# Patient Record
Sex: Male | Born: 1985
Health system: Southern US, Community
[De-identification: ages and names within clinical notes are randomized; demographics above are authoritative.]

## PROBLEM LIST (undated history)

## (undated) DIAGNOSIS — N289 Disorder of kidney and ureter, unspecified: Secondary | ICD-10-CM

## (undated) DIAGNOSIS — I10 Essential (primary) hypertension: Secondary | ICD-10-CM

## (undated) HISTORY — DX: Essential (primary) hypertension: I10

---

## 2015-04-05 DIAGNOSIS — I10 Essential (primary) hypertension: Secondary | ICD-10-CM | POA: Insufficient documentation

## 2015-04-05 DIAGNOSIS — R0683 Snoring: Secondary | ICD-10-CM | POA: Insufficient documentation

## 2017-05-21 DIAGNOSIS — R030 Elevated blood-pressure reading, without diagnosis of hypertension: Secondary | ICD-10-CM | POA: Diagnosis not present

## 2017-05-21 DIAGNOSIS — S339XXA Sprain of unspecified parts of lumbar spine and pelvis, initial encounter: Secondary | ICD-10-CM | POA: Diagnosis not present

## 2017-05-24 ENCOUNTER — Other Ambulatory Visit: Payer: Self-pay

## 2017-05-24 ENCOUNTER — Ambulatory Visit (INDEPENDENT_AMBULATORY_CARE_PROVIDER_SITE_OTHER): Payer: 59 | Admitting: Physician Assistant

## 2017-05-24 ENCOUNTER — Encounter: Payer: Self-pay | Admitting: Physician Assistant

## 2017-05-24 VITALS — BP 150/107 | HR 109 | Temp 98.9°F | Resp 18 | Ht 69.69 in | Wt 311.4 lb

## 2017-05-24 DIAGNOSIS — I10 Essential (primary) hypertension: Secondary | ICD-10-CM

## 2017-05-24 DIAGNOSIS — M545 Low back pain, unspecified: Secondary | ICD-10-CM

## 2017-05-24 DIAGNOSIS — M62838 Other muscle spasm: Secondary | ICD-10-CM

## 2017-05-24 LAB — POCT URINALYSIS DIP (MANUAL ENTRY)
Bilirubin, UA: NEGATIVE
Blood, UA: NEGATIVE
Glucose, UA: NEGATIVE mg/dL
Ketones, POC UA: NEGATIVE mg/dL
LEUKOCYTES UA: NEGATIVE
Nitrite, UA: NEGATIVE
Spec Grav, UA: 1.02 (ref 1.010–1.025)
Urobilinogen, UA: 0.2 E.U./dL
pH, UA: 5.5 (ref 5.0–8.0)

## 2017-05-24 MED ORDER — NAPROXEN 500 MG PO TABS: 500.0000 mg | ORAL_TABLET | Freq: Two times a day (BID) | ORAL | 0 refills | Status: DC

## 2017-05-24 MED ORDER — BLOOD PRESSURE MONITOR DELUXE KIT: 1.0000 [IU] | PACK | Freq: Every day | 0 refills | Status: DC

## 2017-05-24 MED ORDER — LISINOPRIL 10 MG PO TABS: 10.0000 mg | ORAL_TABLET | Freq: Every day | ORAL | 0 refills | Status: DC

## 2017-05-24 NOTE — Patient Instructions (Addendum)
I recommend resting today. However, tomorrow I would begin walking and moving around as much as tolerated. Begin stretching in a couple of days. The worse thing you can do for low back pain is lie in bed all day or sit down all day. Use medications as needed.   Just to know, flexeril can cause side effects that may impair your thinking or reactions. Be careful if you drive or do anything that requires you to be awake and alert. void drinking alcohol, which can increase some of the side effects of Flexeril.  NSAIDs like naproxen have common side effects of heartburn, stomach pain, indigestion, and headache. Could lead to renal insufficiency, stroke, or GI bleed if taken excess amounts outside of what is recommended on label long term.    You should avoid heavy lifting or strenuous repetitive activity to prevent recurrence of event  Use heat pad, do not apply directly to skin, use barrier such as towel over the skin. Leave on for 15-20 minutes, 3-4 times a day.  Please perform exercises below. Stretches are to be performed for 2 sets, holding 10-15 seconds each. Recommended to perform this rehab twice daily within pain tolerance for 2 weeks.  In terms of elevated blood pressure, I would like you to start taking lisinopril daily. Check your blood pressure at least a couple times over the next week outside of the office and document these values. It is best if you check the blood pressure at different times in the day. Your goal is <140/90. Please return in 1-2 weeks for reevaluation.  If you start to have chest pain, blurred vision, shortness of breath, severe headache, lower leg swelling, or nausea/vomiting please seek care immediately here or at the ED.    FLEXION RANGE OF MOTION AND STRETCHING EXERCISES: STRETCH - Flexion, Single Knee to Chest   Lie on a firm bed or floor with both legs extended in front of you.  Keeping one leg in contact with the floor, bring your opposite knee to your chest.  Hold your leg in place by either grabbing behind your thigh or at your knee.  Pull until you feel a gentle stretch in your lower back.   Slowly release your grasp and repeat the exercise with the opposite side.  STRETCH - Flexion, Double Knee to Chest   Lie on a firm bed or floor with both legs extended in front of you.  Keeping one leg in contact with the floor, bring your opposite knee to your chest.  Tense your stomach muscles to support your back and then lift your other knee to your chest. Hold your legs in place by either grabbing behind your thighs or at your knees.  Pull both knees toward your chest until you feel a gentle stretch in your lower back.   Tense your stomach muscles and slowly return one leg at a time to the floor.  STRETCH - Low Trunk Rotation  Lie on a firm bed or floor. Keeping your legs in front of you, bend your knees so they are both pointed toward the ceiling and your feet are flat on the floor.  Extend your arms out to the side. This will stabilize your upper body by keeping your shoulders in contact with the floor.  Gently and slowly drop both knees together to one side until you feel a gentle stretch in your lower back.   Tense your stomach muscles to support your lower back as you bring your knees back to  the starting position. Repeat the exercise to the other side.   EXTENSION RANGE OF MOTION AND FLEXIBILITY EXERCISES: STRETCH - Extension, Prone on Elbows   Lie on your stomach on the floor, a bed will be too soft. Place your palms about shoulder width apart and at the height of your head.  Place your elbows under your shoulders. If this is too painful, stack pillows under your chest.  Allow your body to relax so that your hips drop lower and make contact more completely with the floor.  Slowly return to lying flat on the floor.  RANGE OF MOTION - Extension, Prone Press Ups  Lie on your stomach on the floor, a bed will be too soft. Place your  palms about shoulder width apart and at the height of your head.  Keeping your back as relaxed as possible, slowly straighten your elbows while keeping your hips on the floor. You may adjust the placement of your hands to maximize your comfort. As you gain motion, your hands will come more underneath your shoulders.  Slowly return to lying flat on the floor.  RANGE OF MOTION- Quadruped, Neutral Spine   Assume a hands and knees position on a firm surface. Keep your hands under your shoulders and your knees under your hips. You may place padding under your knees for comfort.  Drop your head and point your tail bone toward the ground below you. This will round out your lower back like an angry cat.    Slowly lift your head and release your tail bone so that your back sags into a large arch, like an old horse.  Repeat this until you feel limber in your lower back.  Now, find your "sweet spot." This will be the most comfortable position somewhere between the two previous positions. This is your neutral spine. Once you have found this position, tense your stomach muscles to support your lower back.  STRENGTHENING EXERCISES - Low Back Strain These exercises may help you when beginning to rehabilitate your injury. These exercises should be done near your "sweet spot." This is the neutral, low-back arch, somewhere between fully rounded and fully arched, that is your least painful position. When performed in this safe range of motion, these exercises can be used for people who have either a flexion or extension based injury. These exercises may resolve your symptoms with or without further involvement from your physician, physical therapist or athletic trainer. While completing these exercises, remember:   Muscles can gain both the endurance and the strength needed for everyday activities through controlled exercises.  Complete these exercises as instructed by your physician, physical therapist or  athletic trainer. Increase the resistance and repetitions only as guided.  You may experience muscle soreness or fatigue, but the pain or discomfort you are trying to eliminate should never worsen during these exercises. If this pain does worsen, stop and make certain you are following the directions exactly. If the pain is still present after adjustments, discontinue the exercise until you can discuss the trouble with your caregiver.  STRENGTHENING - Deep Abdominals, Pelvic Tilt  Lie on a firm bed or floor. Keeping your legs in front of you, bend your knees so they are both pointed toward the ceiling and your feet are flat on the floor.  Tense your lower abdominal muscles to press your lower back into the floor. This motion will rotate your pelvis so that your tail bone is scooping upwards rather than pointing at your feet or  into the floor.  STRENGTHENING - Abdominals, Crunches   Lie on a firm bed or floor. Keeping your legs in front of you, bend your knees so they are both pointed toward the ceiling and your feet are flat on the floor. Cross your arms over your chest.  Slightly tip your chin down without bending your neck.  Tense your abdominals and slowly lift your trunk high enough to just clear your shoulder blades. Lifting higher can put excessive stress on the lower back and does not further strengthen your abdominal muscles.  Control your return to the starting position.  STRENGTHENING - Quadruped, Opposite UE/LE Lift   Assume a hands and knees position on a firm surface. Keep your hands under your shoulders and your knees under your hips. You may place padding under your knees for comfort.  Find your neutral spine and gently tense your abdominal muscles so that you can maintain this position. Your shoulders and hips should form a rectangle that is parallel with the floor and is not twisted.  Keeping your trunk steady, lift your right hand no higher than your shoulder and then your  left leg no higher than your hip. Make sure you are not holding your breath.   Continuing to keep your abdominal muscles tense and your back steady, slowly return to your starting position. Repeat with the opposite arm and leg.  STRENGTHENING - Lower Abdominals, Double Knee Lift  Lie on a firm bed or floor. Keeping your legs in front of you, bend your knees so they are both pointed toward the ceiling and your feet are flat on the floor.  Tense your abdominal muscles to brace your lower back and slowly lift both of your knees until they come over your hips. Be certain not to hold your breath.  POSTURE AND BODY MECHANICS CONSIDERATIONS - Low Back Strain Keeping correct posture when sitting, standing or completing your activities will reduce the stress put on different body tissues, allowing injured tissues a chance to heal and limiting painful experiences. The following are general guidelines for improved posture. Your physician or physical therapist will provide you with any instructions specific to your needs. While reading these guidelines, remember:  The exercises prescribed by your provider will help you have the flexibility and strength to maintain correct postures.  The correct posture provides the best environment for your joints to work. All of your joints have less wear and tear when properly supported by a spine with good posture. This means you will experience a healthier, less painful body.  Correct posture must be practiced with all of your activities, especially prolonged sitting and standing. Correct posture is as important when doing repetitive low-stress activities (typing) as it is when doing a single heavy-load activity (lifting). RESTING POSITIONS Consider which positions are most painful for you when choosing a resting position. If you have pain with flexion-based activities (sitting, bending, stooping, squatting), choose a position that allows you to rest in a less flexed  posture. You would want to avoid curling into a fetal position on your side. If your pain worsens with extension-based activities (prolonged standing, working overhead), avoid resting in an extended position such as sleeping on your stomach. Most people will find more comfort when they rest with their spine in a more neutral position, neither too rounded nor too arched. Lying on a non-sagging bed on your side with a pillow between your knees, or on your back with a pillow under your knees will often provide  some relief. Keep in mind, being in any one position for a prolonged period of time, no matter how correct your posture, can still lead to stiffness. PROPER SITTING POSTURE In order to minimize stress and discomfort on your spine, you must sit with correct posture. Sitting with good posture should be effortless for a healthy body. Returning to good posture is a gradual process. Many people can work toward this most comfortably by using various supports until they have the flexibility and strength to maintain this posture on their own. When sitting with proper posture, your ears will fall over your shoulders and your shoulders will fall over your hips. You should use the back of the chair to support your upper back. Your lower back will be in a neutral position, just slightly arched. You may place a small pillow or folded towel at the base of your lower back for support.  When working at a desk, create an environment that supports good, upright posture. Without extra support, muscles tire, which leads to excessive strain on joints and other tissues. Keep these recommendations in mind: CHAIR:  A chair should be able to slide under your desk when your back makes contact with the back of the chair. This allows you to work closely.  The chair's height should allow your eyes to be level with the upper part of your monitor and your hands to be slightly lower than your elbows. BODY POSITION  Your feet should  make contact with the floor. If this is not possible, use a foot rest.  Keep your ears over your shoulders. This will reduce stress on your neck and lower back. INCORRECT SITTING POSTURES  If you are feeling tired and unable to assume a healthy sitting posture, do not slouch or slump. This puts excessive strain on your back tissues, causing more damage and pain. Healthier options include:  Using more support, like a lumbar pillow.  Switching tasks to something that requires you to be upright or walking.  Talking a brief walk.  Lying down to rest in a neutral-spine position. PROLONGED STANDING WHILE SLIGHTLY LEANING FORWARD  When completing a task that requires you to lean forward while standing in one place for a long time, place either foot up on a stationary 2-4 inch high object to help maintain the best posture. When both feet are on the ground, the lower back tends to lose its slight inward curve. If this curve flattens (or becomes too large), then the back and your other joints will experience too much stress, tire more quickly, and can cause pain. CORRECT STANDING POSTURES Proper standing posture should be assumed with all daily activities, even if they only take a few moments, like when brushing your teeth. As in sitting, your ears should fall over your shoulders and your shoulders should fall over your hips. You should keep a slight tension in your abdominal muscles to brace your spine. Your tailbone should point down to the ground, not behind your body, resulting in an over-extended swayback posture.  INCORRECT STANDING POSTURES  Common incorrect standing postures include a forward head, locked knees and/or an excessive swayback. WALKING Walk with an upright posture. Your ears, shoulders and hips should all line-up. PROLONGED ACTIVITY IN A FLEXED POSITION When completing a task that requires you to bend forward at your waist or lean over a low surface, try to find a way to stabilize 3  out of 4 of your limbs. You can place a hand or elbow on  your thigh or rest a knee on the surface you are reaching across. This will provide you more stability so that your muscles do not fatigue as quickly. By keeping your knees relaxed, or slightly bent, you will also reduce stress across your lower back. CORRECT LIFTING TECHNIQUES DO :   Assume a wide stance. This will provide you more stability and the opportunity to get as close as possible to the object which you are lifting.  Tense your abdominals to brace your spine. Bend at the knees and hips. Keeping your back locked in a neutral-spine position, lift using your leg muscles. Lift with your legs, keeping your back straight.  Test the weight of unknown objects before attempting to lift them.  Try to keep your elbows locked down at your sides in order get the best strength from your shoulders when carrying an object.  Always ask for help when lifting heavy or awkward objects. INCORRECT LIFTING TECHNIQUES DO NOT:   Lock your knees when lifting, even if it is a small object.  Bend and twist. Pivot at your feet or move your feet when needing to change directions.  Assume that you can safely pick up even a paper clip without proper posture.     How to Take Your Blood Pressure You can take your blood pressure at home with a machine. You may need to check your blood pressure at home:  To check if you have high blood pressure (hypertension).  To check your blood pressure over time.  To make sure your blood pressure medicine is working.  Supplies needed: You will need a blood pressure machine, or monitor. You can buy one at a drugstore or online. When choosing one:  Choose one with an arm cuff.  Choose one that wraps around your upper arm. Only one finger should fit between your arm and the cuff.  Do not choose one that measures your blood pressure from your wrist or finger.  Your doctor can suggest a monitor. How to  prepare Avoid these things for 30 minutes before checking your blood pressure:  Drinking caffeine.  Drinking alcohol.  Eating.  Smoking.  Exercising.  Five minutes before checking your blood pressure:  Pee.  Sit in a dining chair. Avoid sitting in a soft couch or armchair.  Be quiet. Do not talk.  How to take your blood pressure Follow the instructions that came with your machine. If you have a digital blood pressure monitor, these may be the instructions: 1. Sit up straight. 2. Place your feet on the floor. Do not cross your ankles or legs. 3. Rest your left arm at the level of your heart. You may rest it on a table, desk, or chair. 4. Pull up your shirt sleeve. 5. Wrap the blood pressure cuff around the upper part of your left arm. The cuff should be 1 inch (2.5 cm) above your elbow. It is best to wrap the cuff around bare skin. 6. Fit the cuff snugly around your arm. You should be able to place only one finger between the cuff and your arm. 7. Put the cord inside the groove of your elbow. 8. Press the power button. 9. Sit quietly while the cuff fills with air and loses air. 10. Write down the numbers on the screen. 11. Wait 2-3 minutes and then repeat steps 1-10.  What do the numbers mean? Two numbers make up your blood pressure. The first number is called systolic pressure. The second is called diastolic  pressure. An example of a blood pressure reading is "120 over 80" (or 120/80). If you are an adult and do not have a medical condition, use this guide to find out if your blood pressure is normal: Normal  First number: below 120.  Second number: below 80. Elevated  First number: 120-129.  Second number: below 80. Hypertension stage 1  First number: 130-139.  Second number: 80-89. Hypertension stage 2  First number: 140 or above.  Second number: 90 or above. Your blood pressure is above normal even if only the top or bottom number is above normal. Follow  these instructions at home:  Check your blood pressure as often as your doctor tells you to.  Take your monitor to your next doctor's appointment. Your doctor will: ? Make sure you are using it correctly. ? Make sure it is working right.  Make sure you understand what your blood pressure numbers should be.  Tell your doctor if your medicines are causing side effects. Contact a doctor if:  Your blood pressure keeps being high. Get help right away if:  Your first blood pressure number is higher than 180.  Your second blood pressure number is higher than 120. This information is not intended to replace advice given to you by your health care provider. Make sure you discuss any questions you have with your health care provider. Document Released: 02/13/2008 Document Revised: 01/29/2016 Document Reviewed: 08/09/2015 Elsevier Interactive Patient Education  2018 [employer].   Hypertension Hypertension is another name for high blood pressure. High blood pressure forces your heart to work harder to pump blood. This can cause problems over time. There are two numbers in a blood pressure reading. There is a top number (systolic) over a bottom number (diastolic). It is best to have a blood pressure below 120/80. Healthy choices can help lower your blood pressure. You may need medicine to help lower your blood pressure if:  Your blood pressure cannot be lowered with healthy choices.  Your blood pressure is higher than 130/80.  Follow these instructions at home: Eating and drinking  If directed, follow the DASH eating plan. This diet includes: ? Filling half of your plate at each meal with fruits and vegetables. ? Filling one quarter of your plate at each meal with whole grains. Whole grains include whole wheat pasta, brown rice, and whole grain bread. ? Eating or drinking low-fat dairy products, such as skim milk or low-fat yogurt. ? Filling one quarter of your plate at each meal with  low-fat (lean) proteins. Low-fat proteins include fish, skinless chicken, eggs, beans, and tofu. ? Avoiding fatty meat, cured and processed meat, or chicken with skin. ? Avoiding premade or processed food.  Eat less than 1,500 mg of salt (sodium) a day.  Limit alcohol use to no more than 1 drink a day for nonpregnant women and 2 drinks a day for men. One drink equals 12 oz of beer, 5 oz of wine, or 1 oz of hard liquor. Lifestyle  Work with your doctor to stay at a healthy weight or to lose weight. Ask your doctor what the best weight is for you.  Get at least 30 minutes of exercise that causes your heart to beat faster (aerobic exercise) most days of the week. This may include walking, swimming, or biking.  Get at least 30 minutes of exercise that strengthens your muscles (resistance exercise) at least 3 days a week. This may include lifting weights or pilates.  Do not use  any products that contain nicotine or tobacco. This includes cigarettes and e-cigarettes. If you need help quitting, ask your doctor.  Check your blood pressure at home as told by your doctor.  Keep all follow-up visits as told by your doctor. This is important. Medicines  Take over-the-counter and prescription medicines only as told by your doctor. Follow directions carefully.  Do not skip doses of blood pressure medicine. The medicine does not work as well if you skip doses. Skipping doses also puts you at risk for problems.  Ask your doctor about side effects or reactions to medicines that you should watch for. Contact a doctor if:  You think you are having a reaction to the medicine you are taking.  You have headaches that keep coming back (recurring).  You feel dizzy.  You have swelling in your ankles.  You have trouble with your vision. Get help right away if:  You get a very bad headache.  You start to feel confused.  You feel weak or numb.  You feel faint.  You get very bad pain in  your: ? Chest. ? Belly (abdomen).  You throw up (vomit) more than once.  You have trouble breathing. Summary  Hypertension is another name for high blood pressure.  Making healthy choices can help lower blood pressure. If your blood pressure cannot be controlled with healthy choices, you may need to take medicine. This information is not intended to replace advice given to you by your health care provider. Make sure you discuss any questions you have with your health care provider. Document Released: 08/19/2007 Document Revised: 01/29/2016 Document Reviewed: 01/29/2016 Elsevier Interactive Patient Education  2018 [employer].   IF you received an x-ray today, you will receive an invoice from [facility]. Please contact [facility] at [phone] with questions or concerns regarding your invoice.   IF you received labwork today, you will receive an invoice from [facility]. Please contact [facility] at [phone] with questions or concerns regarding your invoice.   Our billing staff will not be able to assist you with questions regarding bills from these companies.  You will be contacted with the lab results as soon as they are available. The fastest way to get your results is to activate your My Chart account. Instructions are located on the last page of this paperwork. If you have not heard from [place] regarding the results in 2 weeks, please contact this office.

## 2017-05-24 NOTE — Progress Notes (Addendum)
Anthony Edwards  MRN: [id number] DOB: 1985/10/12  Subjective:   Anthony Edwards is a 32 y.o. male who presents for evaluation of low back pain. The patient has had no prior back problems. Symptoms have been present for 3 days and are gradually improving.  Onset was related to / precipitated by no known injury. 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  Notes he has been lifting more heavy objects at work recently and also moving things out of his home. The pain is located in the right lumbar area and does not radiate. The pain is described as sharp and dull and occurs all day and is worse with movement. Symptoms are exacerbated by standing, twisting, bending, and standing. Symptoms are improved by heat, NSAIDs and rest.  He denies weakness in the right leg, weakness in the left leg, tingling in the right leg, tingling in the left leg, burning pain in the right leg, burning pain in the left leg, urinary hesitancy, urinary incontinence, urinary retention, bowel incontinence, constipation, impotence and groin/perineal numbness associated with the back pain. The patient has no "red flag" history indicative of complicated back pain. Went to urgent care 3 days ago and was dx with muscle spasm. Given Rx for flexeril. Has also used heating pad. Was feeling better yesterday. Did not take muscle relaxant yesterday and then woke up this morning with more pain.   In terms of HTN, had dx a couple of years ago. Was on chlorthalidone 55m daily. Took it for 30 days but then stopped when he ran out and never followed up. Checked it once in the past year and it was 1149systolically. Had lost 100lbs but gained 50 lbs back. He is not currently exercising outside of work, which is strenuous. Diet consists of various things, eats mostly at home. Does not add salt to meals. Drinks mostly water and sweet tea. Denies smoking. Occasional alcohol use. Denies chest pain,  heart papliations, dizziness, SOB, diaphoresis, chronic headache, visual disturbance, hematuria, and lower leg swelling.  His old PCP also referred him for sleep study due to daytime sleepiness and snoring but pt did not go. Notes this is not an issue at this time and declines referral.   Review of Systems  Constitutional: Negative for chills, fatigue and fever.  Gastrointestinal: Negative for abdominal pain, nausea and vomiting.  Genitourinary: Negative for flank pain, hematuria and testicular pain.  Neurological: Negative for speech difficulty.    There are no active problems to display for this patient.   Current Outpatient Medications on File Prior to Visit  Medication Sig Dispense Refill  . chlorthalidone (HYGROTON) 25 MG tablet Take 25 mg by mouth.    . cyclobenzaprine (FLEXERIL) 10 MG tablet TK 1 T PO BID PRM  0   No current facility-administered medications on file prior to visit.     No Known Allergies    Social History   Socioeconomic History  . Marital status: Single    Spouse name: Not on file  . Number of children: 0  . Years of education: Not on file  . Highest education level: Not on file  Social Needs  . Financial resource strain: Not on file  . Food insecurity - worry: Not on file  . Food insecurity - inability: Not on file  . Transportation needs - medical: Not on file  . Transportation needs - non-medical: Not on file  Occupational History  .  Not on file  Tobacco Use  . Smoking status: Never Smoker  . Smokeless tobacco: Never Used  Substance and Sexual Activity  . Alcohol use: Yes    Alcohol/week: 1.8 oz    Types: 3 Cans of beer per week  . Drug use: No  . Sexual activity: Yes  Other Topics Concern  . Not on file  Social History Narrative  . Not on file    Objective:  BP (!) 150/107   Pulse (!) 109   Temp 98.9 F (37.2 C) (Oral)   Resp 18   Ht 5' 9.69" (1.77 m)   Wt (!) 311 lb 6.4 oz (141.3 kg)   SpO2 98%   BMI 45.09 kg/m   Physical  Exam  Constitutional: He is oriented to person, place, and time.  Well-developed, well-nourished male. Appears uncomfortable sitting on exam chair.   HENT:  Head: Normocephalic and atraumatic.  Eyes: Conjunctivae are normal.  Neck: Normal range of motion.  Cardiovascular: Normal rate, regular rhythm, normal heart sounds and intact distal pulses.  Pulmonary/Chest: Effort normal and breath sounds normal. He has no wheezes. He has no rhonchi. He has no rales.  Abdominal: There is no CVA tenderness.  Musculoskeletal:       Cervical back: Normal.       Thoracic back: Normal.       Lumbar back: He exhibits decreased range of motion (ROM not accessed as patient does not want to aggravate spasm), tenderness (exquisite tenderness with palpation of right sided musculature, spasm palpated, tendneress in musculature when right leg is raised) and spasm. He exhibits no bony tenderness and no swelling.       Right lower leg: He exhibits no swelling.       Left lower leg: He exhibits no swelling.  Neurological: He is alert and oriented to person, place, and time. He has a normal Straight Leg Raise Test. Gait normal.  Reflex Scores:      Patellar reflexes are 2+ on the right side and 2+ on the left side.      Achilles reflexes are 2+ on the right side and 2+ on the left side. Strength is 5/5 of bilateral lower extremities.  Sensation of bilateral lower extremities intact.   Skin: Skin is warm and dry.  Psychiatric: Affect normal.  Vitals reviewed.    BP Readings from Last 3 Encounters:  05/24/17 (!) 150/107   Results for orders placed or performed in visit on 05/24/17 (from the past 24 hour(s))  POCT urinalysis dipstick     Status: Abnormal   Collection Time: 05/24/17 12:49 PM  Result Value Ref Range   Color, UA yellow yellow   Clarity, UA clear clear   Glucose, UA negative negative mg/dL   Bilirubin, UA negative negative   Ketones, POC UA negative negative mg/dL   Spec Grav, UA 1.020 1.010 -  1.025   Blood, UA negative negative   pH, UA 5.5 5.0 - 8.0   Protein Ur, POC =30 (A) negative mg/dL   Urobilinogen, UA 0.2 0.2 or 1.0 E.U./dL   Nitrite, UA Negative Negative   Leukocytes, UA Negative Negative     Assessment and Plan :  1. Acute right-sided low back pain without sciatica 2. Muscle spasm History and physical exam findings consistent with muscle spasm.  No red flags noted in history or physical exam. No midline tenderness.  Neuro exam intact.  Recommend heating pad, stretching, anti-inflammatories, and muscle relaxant as needed.  Given Neurosurgeon for  stretches.  Given strict return precautions. - naproxen (NAPROSYN) 500 MG tablet; Take 1 tablet (500 mg total) by mouth 2 (two) times daily with a meal.  Dispense: 30 tablet; Refill: 0  3. Essential hypertension Uncontrolled in office.  Likely partially due to pt being in pain but with his dx of HTN recommend restarting blood pressure medication today.  He is otherwise asymptomatic.  Labs pending.  Instructed to check bp outside of office over the next couple of weeks.  Educated on healthy lifestyle modifications.  Patient is motivated to eat healthier and lose weight.  Return in 1-2 weeks for reevaluation.  Given strict ED precautions.  - CBC with Differential/Platelet - CMP14+EGFR - TSH - Lipid panel - POCT urinalysis dipstick - lisinopril (PRINIVIL,ZESTRIL) 10 MG tablet; Take 1 tablet (10 mg total) by mouth daily.  Dispense: 30 tablet; Refill: 0 - Blood Pressure Monitoring (BLOOD PRESSURE MONITOR DELUXE) KIT; 1 Units by Does not apply route daily.  Dispense: 1 kit; Refill: 0  Tenna Delaine PA-C  [facility] 05/24/2017 10:34 PM

## 2017-05-25 LAB — CBC WITH DIFFERENTIAL/PLATELET
BASOS ABS: 0 10*3/uL (ref 0.0–0.2)
Basos: 0 %
EOS (ABSOLUTE): 0.2 10*3/uL (ref 0.0–0.4)
Eos: 2 %
Hematocrit: 47.6 % (ref 37.5–51.0)
Hemoglobin: 16 g/dL (ref 13.0–17.7)
Immature Grans (Abs): 0.1 10*3/uL (ref 0.0–0.1)
Immature Granulocytes: 1 %
LYMPHS ABS: 4.8 10*3/uL — AB (ref 0.7–3.1)
Lymphs: 40 %
MCH: 29.6 pg (ref 26.6–33.0)
MCHC: 33.6 g/dL (ref 31.5–35.7)
MCV: 88 fL (ref 79–97)
MONOS ABS: 0.7 10*3/uL (ref 0.1–0.9)
Monocytes: 6 %
NEUTROS ABS: 6.3 10*3/uL (ref 1.4–7.0)
Neutrophils: 51 %
PLATELETS: 331 10*3/uL (ref 150–379)
RBC: 5.4 x10E6/uL (ref 4.14–5.80)
RDW: 14.4 % (ref 12.3–15.4)
WBC: 12.1 10*3/uL — ABNORMAL HIGH (ref 3.4–10.8)

## 2017-05-25 LAB — CMP14+EGFR
ALBUMIN: 4.6 g/dL (ref 3.5–5.5)
ALK PHOS: 79 IU/L (ref 39–117)
ALT: 21 IU/L (ref 0–44)
AST: 15 IU/L (ref 0–40)
Albumin/Globulin Ratio: 1.6 (ref 1.2–2.2)
BUN/Creatinine Ratio: 15 (ref 9–20)
BUN: 16 mg/dL (ref 6–20)
Bilirubin Total: 0.3 mg/dL (ref 0.0–1.2)
CHLORIDE: 103 mmol/L (ref 96–106)
CO2: 22 mmol/L (ref 20–29)
Calcium: 10.1 mg/dL (ref 8.7–10.2)
Creatinine, Ser: 1.08 mg/dL (ref 0.76–1.27)
GFR calc Af Amer: 104 mL/min/{1.73_m2} (ref >59)
GFR calc non Af Amer: 90 mL/min/{1.73_m2} (ref >59)
Globulin, Total: 2.8 g/dL (ref 1.5–4.5)
Glucose: 92 mg/dL (ref 65–99)
Potassium: 4.5 mmol/L (ref 3.5–5.2)
Sodium: 145 mmol/L — ABNORMAL HIGH (ref 134–144)
Total Protein: 7.4 g/dL (ref 6.0–8.5)

## 2017-05-25 LAB — LIPID PANEL
Chol/HDL Ratio: 3.3 ratio (ref 0.0–5.0)
Cholesterol, Total: 143 mg/dL (ref 100–199)
HDL: 44 mg/dL (ref >39)
LDL Calculated: 76 mg/dL (ref 0–99)
Triglycerides: 113 mg/dL (ref 0–149)
VLDL Cholesterol Cal: 23 mg/dL (ref 5–40)

## 2017-05-25 LAB — TSH: TSH: 2.3 u[IU]/mL (ref 0.450–4.500)

## 2017-05-27 ENCOUNTER — Encounter: Payer: Self-pay | Admitting: Physician Assistant

## 2017-05-28 ENCOUNTER — Encounter: Payer: Self-pay | Admitting: Physician Assistant

## 2017-05-28 ENCOUNTER — Ambulatory Visit (INDEPENDENT_AMBULATORY_CARE_PROVIDER_SITE_OTHER): Payer: 59 | Admitting: Physician Assistant

## 2017-05-28 ENCOUNTER — Telehealth: Payer: Self-pay | Admitting: Physician Assistant

## 2017-05-28 ENCOUNTER — Other Ambulatory Visit: Payer: Self-pay

## 2017-05-28 VITALS — BP 138/82 | HR 113 | Temp 98.9°F | Ht 70.08 in | Wt 313.0 lb

## 2017-05-28 DIAGNOSIS — M545 Low back pain, unspecified: Secondary | ICD-10-CM

## 2017-05-28 DIAGNOSIS — R319 Hematuria, unspecified: Secondary | ICD-10-CM | POA: Diagnosis not present

## 2017-05-28 DIAGNOSIS — I10 Essential (primary) hypertension: Secondary | ICD-10-CM | POA: Diagnosis not present

## 2017-05-28 DIAGNOSIS — M62838 Other muscle spasm: Secondary | ICD-10-CM

## 2017-05-28 LAB — POC MICROSCOPIC URINALYSIS (UMFC): Mucus: ABSENT

## 2017-05-28 LAB — POCT URINALYSIS DIP (MANUAL ENTRY)
BILIRUBIN UA: NEGATIVE
GLUCOSE UA: NEGATIVE mg/dL
Ketones, POC UA: NEGATIVE mg/dL
Nitrite, UA: NEGATIVE
PH UA: 6 (ref 5.0–8.0)
Protein Ur, POC: 30 mg/dL — AB
SPEC GRAV UA: 1.02 (ref 1.010–1.025)
Urobilinogen, UA: 0.2 E.U./dL

## 2017-05-28 LAB — CREATININE, SERUM
CREATININE: 0.92 mg/dL (ref 0.76–1.27)
GFR calc non Af Amer: 109 mL/min/{1.73_m2} (ref >59)
GFR, EST AFRICAN AMERICAN: 126 mL/min/{1.73_m2} (ref >59)

## 2017-05-28 NOTE — Telephone Encounter (Signed)
[facility]  [facility] can't [facility] for Monday without it being STAT. Can we get the order updated to say this and I will call them back to schedule? Thanks!

## 2017-05-28 NOTE — Patient Instructions (Addendum)
You should hear from [facility] for [facility] renal study over the weekend or on Monday for the scan. If you develop any new pain over the weekend please contact our office or seek care at an urgent care. Thank you for letting me participate in your health and well being.    IF you received an x-ray today, you will receive an invoice from [facility]. Please contact [facility] at [phone] with questions or concerns regarding your invoice.   IF you received labwork today, you will receive an invoice from [facility]. Please contact [facility] at [phone] with questions or concerns regarding your invoice.   Our billing staff will not be able to assist you with questions regarding bills from these companies.  You will be contacted with the lab results as soon as they are available. The fastest way to get your results is to activate your My Chart account. Instructions are located on the last page of this paperwork. If you have not heard from us regarding the results in 2 weeks, please contact this office.

## 2017-05-28 NOTE — Telephone Encounter (Signed)
Updated. Thanks

## 2017-05-28 NOTE — Progress Notes (Addendum)
NIKODEM LEADBETTER  MRN: [id number] DOB: 1985/03/21  Subjective:  Anthony Edwards is a 32 y.o. male seen in office today for a chief complaint of follow up on back pain. Pt initially seen on 05/24/17 for low back pain. It started after lots of heavy lifting last week. PE findings consistent with muscle spasm. Also found to have uncontrolled untreated HTN at that visits. Labs obtained. UA +protein. Tx for muscle spasms with naproxen and continued flexeril, stretching, and heat. Started lisinopril for HTN. Please see that OV note for additional details. Today, he reports that his back pain is much better. Resting it is about a 3/10. However, when he tries to bend (or lift heavy objects), it exacerbates his pain to 8/10. He has taken medication as prescribed. He is applying heat, some stretching. He is requesting a few more days off as he has to continuously lift objects weighing >50lbs while at work. He denies weakness in the right leg, weakness in the left leg, tingling in the right leg, tingling in the left leg, burning pain in the right leg, burning pain in the left leg, hematuria, urinary hesitancy, urinary incontinence, urinary retention, bowel incontinence, constipation, impotence and groin/perineal numbness associated with the back pain. Has hx of kidneys stones, has had 2 non obstructed ones in the past. Denies smoking. Denies recent travel. No PH of bladder cancer.   HTN: Has been taking bp medication as prescribed. Has not check bp at home.   Review of Systems  Constitutional: Negative for chills, diaphoresis and fever.  HENT: Negative for congestion.   Respiratory: Negative for cough.   Cardiovascular: Negative for chest pain.  Genitourinary: Negative for difficulty urinating and frequency.    There are no active problems to display for this patient.   Current Outpatient Medications on File Prior to Visit  Medication Sig Dispense Refill  . Blood Pressure Monitoring (BLOOD PRESSURE MONITOR  DELUXE) KIT 1 Units by Does not apply route daily. 1 kit 0  . cyclobenzaprine (FLEXERIL) 10 MG tablet TK 1 T PO BID PRM  0  . lisinopril (PRINIVIL,ZESTRIL) 10 MG tablet Take 1 tablet (10 mg total) by mouth daily. 30 tablet 0  . naproxen (NAPROSYN) 500 MG tablet Take 1 tablet (500 mg total) by mouth 2 (two) times daily with a meal. 30 tablet 0   No current facility-administered medications on file prior to visit.     No Known Allergies    Social History   Socioeconomic History  . Marital status: Significant Other    Spouse name: Not on file  . Number of children: 0  . Years of education: Not on file  . Highest education level: Not on file  Social Needs  . Financial resource strain: Not on file  . Food insecurity - worry: Not on file  . Food insecurity - inability: Not on file  . Transportation needs - medical: Not on file  . Transportation needs - non-medical: Not on file  Occupational History  . Not on file  Tobacco Use  . Smoking status: Never Smoker  . Smokeless tobacco: Never Used  Substance and Sexual Activity  . Alcohol use: Yes    Alcohol/week: 1.8 oz    Types: 3 Cans of beer per week  . Drug use: No  . Sexual activity: Yes  Other Topics Concern  . Not on file  Social History Narrative  . Not on file    Objective:  BP 138/82 (BP Location: Left Arm, Patient  Position: Sitting, Cuff Size: Large)   Pulse (!) 113   Temp 98.9 F (37.2 C) (Oral)   Ht 5' 10.08" (1.78 m)   Wt (!) 313 lb (142 kg)   SpO2 97%   BMI 44.81 kg/m   Physical Exam  Constitutional: He is oriented to person, place, and time and well-developed, well-nourished, and in no distress.  HENT:  Head: Normocephalic and atraumatic.  Eyes: Conjunctivae are normal.  Neck: Normal range of motion.  Pulmonary/Chest: Effort normal.  Abdominal: There is no CVA tenderness.  Musculoskeletal:       Cervical back: Normal.       Thoracic back: Normal.       Lumbar back: He exhibits tenderness (moderate  with palpation of right sided musculature). He exhibits no bony tenderness and no swelling.  Lumbar back with normal forward flexion, other ROM not accessed   Neurological: He is alert and oriented to person, place, and time. Gait normal.  Skin: Skin is warm and dry.  Psychiatric: Affect normal.  Vitals reviewed.    Results for orders placed or performed in visit on 05/28/17 (from the past 24 hour(s))  POCT urinalysis dipstick     Status: Abnormal   Collection Time: 05/28/17  9:49 AM  Result Value Ref Range   Color, UA straw (A) yellow   Clarity, UA clear clear   Glucose, UA negative negative mg/dL   Bilirubin, UA negative negative   Ketones, POC UA negative negative mg/dL   Spec Grav, UA 1.020 1.010 - 1.025   Blood, UA large (A) negative   pH, UA 6.0 5.0 - 8.0   Protein Ur, POC =30 (A) negative mg/dL   Urobilinogen, UA 0.2 0.2 or 1.0 E.U./dL   Nitrite, UA Negative Negative   Leukocytes, UA Trace (A) Negative  POCT Microscopic Urinalysis ([facility])     Status: Abnormal   Collection Time: 05/28/17 10:14 AM  Result Value Ref Range   WBC,UR,HPF,POC None None WBC/hpf   RBC,UR,HPF,POC Too numerous to count  (A) None RBC/hpf   Bacteria None None, Too numerous to count   Mucus Absent Absent   Epithelial Cells, UR Per Microscopy Few (A) None, Too numerous to count cells/hpf   BP Readings from Last 3 Encounters:  05/28/17 138/82  05/24/17 (!) 150/107     Assessment and Plan :  This case was precepted with Dr. Tamala Julian.  1. Acute right-sided low back pain without sciatica 2.Muscle Spasm Improving since last visit, which is reassuring. Pain is aggravated with heavy lifting and certain movements. I do recommend resting and avoiding heavy lifting until pain subsides. Continue with tx plan. Follow up in one week for reevaluation or sooner if sx worsen/develop new concerning sx. - POCT urinalysis dipstick  3. Hematuria, unspecified type Unclear etiology at this time. Do not suspect adverse  rxn to lisinopril or naproxen at this time. Labs pending. Pt has hx of kidney stones, although his current presentation is not consistent with typical kidney stone presentation, will order urgent CT renal study. If creatinine and CT normal, will repeat UA and micro, if hematuria persists, referral to urology.  - POCT Microscopic Urinalysis ([facility]) - Creatinine, serum - CT RENAL STONE STUDY; Future  4. Essential hypertension Much better today compared to most recent visit. Continue with medication. Follow up in one week.   Tenna Delaine PA-C  [facility] 05/28/2017 10:07 AM

## 2017-05-31 ENCOUNTER — Ambulatory Visit (HOSPITAL_COMMUNITY)
Admission: RE | Admit: 2017-05-31 | Discharge: 2017-05-31 | Disposition: A | Discharge status: Home / Self Care | Payer: 59 | Source: Ambulatory Visit | Attending: Physician Assistant | Admitting: Physician Assistant

## 2017-05-31 ENCOUNTER — Encounter (HOSPITAL_COMMUNITY): Payer: Self-pay

## 2017-05-31 DIAGNOSIS — M461 Sacroiliitis, not elsewhere classified: Secondary | ICD-10-CM | POA: Diagnosis not present

## 2017-05-31 DIAGNOSIS — R319 Hematuria, unspecified: Secondary | ICD-10-CM | POA: Diagnosis present

## 2017-05-31 DIAGNOSIS — R109 Unspecified abdominal pain: Secondary | ICD-10-CM | POA: Diagnosis not present

## 2017-06-01 ENCOUNTER — Telehealth: Payer: Self-pay | Admitting: Physician Assistant

## 2017-06-01 NOTE — Telephone Encounter (Signed)
Patient needs to get disability forms completed by Barnett AbuWiseman for his recent OV for low back pain. I have completed what I could from the OV notes and highlighted the areas I was not sure about. I will place the forms in Wiseman's box on 06/01/17 please return to the FMLA/Disability desk within 5-7 business days. Thank you!

## 2017-06-02 ENCOUNTER — Telehealth: Payer: Self-pay | Admitting: Physician Assistant

## 2017-06-02 NOTE — Telephone Encounter (Signed)
Paperwork was just received and placed in the provider box on 06/01/17 they have 5-7 business days to complete once the forms are finished I will [facility]

## 2017-06-02 NOTE — Telephone Encounter (Signed)
Copied from CRM [phone]#[id number]. Topic: Quick Communication - See Telephone Encounter >> Jun 02, 2017 11:21 AM Eston Mouldavis, Noell Shular B wrote: CRM for notification. See Telephone encounter for:  Tobi Bastosnna from [facility] would like a call back for information on pts STD  she needs dates of disability and a dx  call back  [phone]    claim number  [id number] 06/02/17.

## 2017-06-04 ENCOUNTER — Ambulatory Visit (INDEPENDENT_AMBULATORY_CARE_PROVIDER_SITE_OTHER): Payer: 59 | Admitting: Physician Assistant

## 2017-06-04 ENCOUNTER — Encounter: Payer: Self-pay | Admitting: Physician Assistant

## 2017-06-04 ENCOUNTER — Other Ambulatory Visit: Payer: Self-pay

## 2017-06-04 VITALS — BP 147/95 | HR 81 | Temp 98.4°F | Resp 18 | Ht 69.29 in | Wt 314.2 lb

## 2017-06-04 DIAGNOSIS — R319 Hematuria, unspecified: Secondary | ICD-10-CM | POA: Diagnosis not present

## 2017-06-04 DIAGNOSIS — M545 Low back pain, unspecified: Secondary | ICD-10-CM

## 2017-06-04 DIAGNOSIS — I10 Essential (primary) hypertension: Secondary | ICD-10-CM

## 2017-06-04 LAB — POC MICROSCOPIC URINALYSIS (UMFC): Mucus: ABSENT

## 2017-06-04 LAB — POCT URINALYSIS DIP (MANUAL ENTRY)
BILIRUBIN UA: NEGATIVE
Glucose, UA: NEGATIVE mg/dL
Ketones, POC UA: NEGATIVE mg/dL
LEUKOCYTES UA: NEGATIVE
NITRITE UA: NEGATIVE
PH UA: 6.5 (ref 5.0–8.0)
PROTEIN UA: NEGATIVE mg/dL
Spec Grav, UA: 1.015 (ref 1.010–1.025)
Urobilinogen, UA: 0.2 E.U./dL

## 2017-06-04 MED ORDER — LISINOPRIL 20 MG PO TABS: 20.0000 mg | ORAL_TABLET | Freq: Every day | ORAL | 0 refills | Status: DC

## 2017-06-04 NOTE — Patient Instructions (Addendum)
Please follow up with urology as planned.  They should contact you within 2 weeks.   In terms of elevated blood pressure, start taking lisinopril 20. I would like you to check your blood pressure at least a couple times over the next week outside of the office and document these values. It is best if you check the blood pressure at different times in the day. Your goal is <140/90. If your values are consistently above this goal, please return to office for further evaluation. Otherwise, return in 3 months. If you start to have chest pain, blurred vision, shortness of breath, severe headache, lower leg swelling, or nausea/vomiting please seek care immediately here or at the ED.    IF you received an x-ray today, you will receive an invoice from [facility]. Please contact [facility] at [phone] with questions or concerns regarding your invoice.   IF you received labwork today, you will receive an invoice from [facility]. Please contact [facility] at [phone] with questions or concerns regarding your invoice.   Our billing staff will not be able to assist you with questions regarding bills from these companies.  You will be contacted with the lab results as soon as they are available. The fastest way to get your results is to activate your My Chart account. Instructions are located on the last page of this paperwork. If you have not heard from us regarding the results in 2 weeks, please contact this office.

## 2017-06-04 NOTE — Telephone Encounter (Signed)
Completed and placed in FMLA box.  

## 2017-06-04 NOTE — Progress Notes (Signed)
Anthony Edwards  MRN: [id number] DOB: Oct 27, 1985  Subjective:  Anthony Edwards is a 32 y.o. male seen in office today for a chief complaint of follow-up on back pain, hematuria, and hypertension.  He also needs FMLA forms filled out.  In terms of back pain, he is doing much better.  The pain is resolved.  He has not taken any medication since earlier this week.  He is able to bend without eliciting spasm.  He believes he can go back to work with no restrictions.  In terms of hypertension, he has not checked his blood pressure outside the office.  Has been taking his medication as prescribed.  He denies chest pain, shortness of breath, headache, palpitations, lower leg swelling, visual disturbance.  Denies smoking.  Has no other questions or concerns.  Review of Systems  Constitutional: Negative for chills, diaphoresis, fatigue and fever.  Genitourinary: Negative for dysuria, flank pain, frequency, hematuria and urgency.  Neurological: Negative for dizziness and light-headedness.    There are no active problems to display for this patient.   Current Outpatient Medications on File Prior to Visit  Medication Sig Dispense Refill  . Blood Pressure Monitoring (BLOOD PRESSURE MONITOR DELUXE) KIT 1 Units by Does not apply route daily. 1 kit 0  . cyclobenzaprine (FLEXERIL) 10 MG tablet TK 1 T PO BID PRM  0  . naproxen (NAPROSYN) 500 MG tablet Take 1 tablet (500 mg total) by mouth 2 (two) times daily with a meal. (Patient not taking: Reported on 06/04/2017) 30 tablet 0   No current facility-administered medications on file prior to visit.     No Known Allergies   Objective:  BP (!) 147/95   Pulse 81   Temp (!) 100.8 F (38.2 C) (Oral)   Resp 18   Ht 5' 9.29" (1.76 m)   Wt (!) 314 lb 3.2 oz (142.5 kg)   SpO2 100%   BMI 46.01 kg/m   Physical Exam  Constitutional: He is oriented to person, place, and time and well-developed, well-nourished, and in no distress.  HENT:  Head:  Normocephalic and atraumatic.  Eyes: Conjunctivae are normal.  Neck: Normal range of motion.  Cardiovascular: Normal rate, regular rhythm and normal heart sounds.  Pulmonary/Chest: Effort normal.  Musculoskeletal:       Lumbar back: Normal. He exhibits normal range of motion, no tenderness, no bony tenderness and no spasm.  Neurological: He is alert and oriented to person, place, and time. Gait normal.  Skin: Skin is warm and dry.  Psychiatric: Affect normal.  Vitals reviewed.    Results for orders placed or performed in visit on 06/04/17 (from the past 24 hour(s))  POCT urinalysis dipstick     Status: Abnormal   Collection Time: 06/04/17  9:40 AM  Result Value Ref Range   Color, UA yellow yellow   Clarity, UA clear clear   Glucose, UA negative negative mg/dL   Bilirubin, UA negative negative   Ketones, POC UA negative negative mg/dL   Spec Grav, UA 1.015 1.010 - 1.025   Blood, UA small (A) negative   pH, UA 6.5 5.0 - 8.0   Protein Ur, POC negative negative mg/dL   Urobilinogen, UA 0.2 0.2 or 1.0 E.U./dL   Nitrite, UA Negative Negative   Leukocytes, UA Negative Negative  POCT Microscopic Urinalysis ([facility])     Status: Abnormal   Collection Time: 06/04/17  9:55 AM  Result Value Ref Range   WBC,UR,HPF,POC None None WBC/hpf  RBC,UR,HPF,POC Few (A) None RBC/hpf   Bacteria None None, Too numerous to count   Mucus Absent Absent   Epithelial Cells, UR Per Microscopy None None, Too numerous to count cells/hpf     BP Readings from Last 3 Encounters:  06/04/17 (!) 147/95  05/28/17 138/82  05/24/17 (!) 150/107      Assessment and Plan :  1. Hematuria, unspecified type Hematuria persists.  CT renal study with single stone in lower pole of right kidney, no ureteral or bladder calculi.  Results reviewed with patient.  Recommend referral to urology for further evaluation of hematuria. - POCT urinalysis dipstick - POCT Microscopic Urinalysis ([facility]) - Ambulatory referral to  Urology  2. Essential hypertension Controlled at this time.Asymptomatic.  Plan to increase lisinopril from 10 mg to 20 mg daily.  Instructed to check bp outside of office over the next couple of weeks. Return if consistently >140/90.  Otherwise, follow-up in 3 months.  Given strict ED precautions.  - lisinopril (PRINIVIL,ZESTRIL) 20 MG tablet; Take 1 tablet (20 mg total) by mouth daily.  Dispense: 90 tablet; Refill: 0  3. Acute right-sided low back pain without sciatica Resolved.  FMLA forms completed with patient in office.   A total of 25 minutes was spent in the room with the patient, greater than 50% of which was in counseling/coordination of care regarding hematuria, HTN, and low back pain.  Tenna Delaine PA-C  [facility] 06/04/2017 9:57 AM

## 2017-07-06 ENCOUNTER — Other Ambulatory Visit: Payer: Self-pay | Admitting: Physician Assistant

## 2017-07-06 DIAGNOSIS — I10 Essential (primary) hypertension: Secondary | ICD-10-CM

## 2017-07-06 MED ORDER — LISINOPRIL 20 MG PO TABS: 20.0000 mg | ORAL_TABLET | Freq: Every day | ORAL | 0 refills | Status: DC

## 2017-07-30 ENCOUNTER — Telehealth: Payer: Self-pay | Admitting: Physician Assistant

## 2017-07-30 NOTE — Telephone Encounter (Signed)
MyCHart message sent to pt about changing apt on 08/15/17 with wiseman

## 2017-09-02 ENCOUNTER — Ambulatory Visit: Payer: 59 | Admitting: Physician Assistant

## 2017-10-20 ENCOUNTER — Other Ambulatory Visit: Payer: Self-pay

## 2017-10-20 ENCOUNTER — Emergency Department (HOSPITAL_COMMUNITY): Payer: 59

## 2017-10-20 ENCOUNTER — Encounter (HOSPITAL_COMMUNITY): Payer: Self-pay

## 2017-10-20 ENCOUNTER — Emergency Department (HOSPITAL_COMMUNITY)
Admission: EM | Admit: 2017-10-20 | Discharge: 2017-10-20 | Disposition: A | Discharge status: Home / Self Care | Payer: 59 | Attending: Emergency Medicine | Admitting: Emergency Medicine

## 2017-10-20 DIAGNOSIS — K8689 Other specified diseases of pancreas: Secondary | ICD-10-CM

## 2017-10-20 DIAGNOSIS — I10 Essential (primary) hypertension: Secondary | ICD-10-CM | POA: Diagnosis not present

## 2017-10-20 DIAGNOSIS — R109 Unspecified abdominal pain: Secondary | ICD-10-CM

## 2017-10-20 DIAGNOSIS — K869 Disease of pancreas, unspecified: Secondary | ICD-10-CM | POA: Insufficient documentation

## 2017-10-20 HISTORY — DX: Disorder of kidney and ureter, unspecified: N28.9

## 2017-10-20 LAB — URINALYSIS, ROUTINE W REFLEX MICROSCOPIC
BILIRUBIN URINE: NEGATIVE
Bacteria, UA: NONE SEEN
Glucose, UA: NEGATIVE mg/dL
Ketones, ur: NEGATIVE mg/dL
Leukocytes, UA: NEGATIVE
NITRITE: NEGATIVE
Protein, ur: 100 mg/dL — AB
SPECIFIC GRAVITY, URINE: 1.026 (ref 1.005–1.030)
pH: 6 (ref 5.0–8.0)

## 2017-10-20 LAB — HEPATIC FUNCTION PANEL
ALBUMIN: 4.6 g/dL (ref 3.5–5.0)
ALT: 27 U/L (ref 0–44)
AST: 22 U/L (ref 15–41)
Alkaline Phosphatase: 61 U/L (ref 38–126)
BILIRUBIN INDIRECT: 0.6 mg/dL (ref 0.3–0.9)
Bilirubin, Direct: 0.1 mg/dL (ref 0.0–0.2)
TOTAL PROTEIN: 7.9 g/dL (ref 6.5–8.1)
Total Bilirubin: 0.7 mg/dL (ref 0.3–1.2)

## 2017-10-20 LAB — BASIC METABOLIC PANEL
Anion gap: 9 (ref 5–15)
BUN: 20 mg/dL (ref 6–20)
CHLORIDE: 105 mmol/L (ref 98–111)
CO2: 27 mmol/L (ref 22–32)
CREATININE: 1.03 mg/dL (ref 0.61–1.24)
Calcium: 9.7 mg/dL (ref 8.9–10.3)
GFR calc Af Amer: >60 mL/min (ref >60)
GFR calc non Af Amer: >60 mL/min (ref >60)
Glucose, Bld: 104 mg/dL — ABNORMAL HIGH (ref 70–99)
Potassium: 3.8 mmol/L (ref 3.5–5.1)
SODIUM: 141 mmol/L (ref 135–145)

## 2017-10-20 LAB — CBC
HCT: 45.7 % (ref 39.0–52.0)
Hemoglobin: 15.5 g/dL (ref 13.0–17.0)
MCH: 29.6 pg (ref 26.0–34.0)
MCHC: 33.9 g/dL (ref 30.0–36.0)
MCV: 87.4 fL (ref 78.0–100.0)
PLATELETS: 302 10*3/uL (ref 150–400)
RBC: 5.23 MIL/uL (ref 4.22–5.81)
RDW: 13.9 % (ref 11.5–15.5)
WBC: 11.7 10*3/uL — AB (ref 4.0–10.5)

## 2017-10-20 LAB — LIPASE, BLOOD: LIPASE: 28 U/L (ref 11–51)

## 2017-10-20 NOTE — ED Notes (Signed)
Patient transported to CT 

## 2017-10-20 NOTE — ED Triage Notes (Signed)
patient c/o right flank pain since 0830 today. patient reports he had a known kidney stone.

## 2017-10-20 NOTE — ED Provider Notes (Signed)
[facility] DEPT Provider Note  CSN: [id number] Arrival date & time: 10/20/17  1119  History   Chief Complaint Chief Complaint  Patient presents with  . Flank Pain    HPI Anthony Edwards is a 32 y.o. male with a medical history of HTN and nephrolithiasis who presented to the ED for right sided flank pain x6 hours. He describes the pain as sharp and cramping. Patient reports having a kidney stone 5 years ago and states that this feels similar to that. Associated symptoms: nausea and dark urine. Denies fever, abdominal pain, vomiting, changes in bowel habits, dysuria, decreased urine, changes in appetite, weight loss or skin color changes.  Additional history obtained from medical chart. Patient had CT stone study in 05/2017 which showed single stone in right kidney without obstruction or stone in ureters or bladder.  Past Medical History:  Diagnosis Date  . Hypertension   . Renal disorder     There are no active problems to display for this patient.   History reviewed. No pertinent surgical history.      Home Medications    Prior to Admission medications   Medication Sig Start Date End Date Taking? Authorizing Provider  ibuprofen (ADVIL,MOTRIN) 200 MG tablet Take 200-600 mg by mouth daily as needed for moderate pain.   Yes [provider]  omeprazole (PRILOSEC) 20 MG capsule Take 20 mg by mouth daily as needed (stomach).   Yes [provider]  Blood Pressure Monitoring (BLOOD PRESSURE MONITOR DELUXE) KIT 1 Units by Does not apply route daily. 05/24/17   Tenna Delaine D, PA-C  lisinopril (PRINIVIL,ZESTRIL) 20 MG tablet Take 1 tablet (20 mg total) by mouth daily. Patient not taking: Reported on 10/20/2017 07/06/17   Tenna Delaine D, PA-C  naproxen (NAPROSYN) 500 MG tablet Take 1 tablet (500 mg total) by mouth 2 (two) times daily with a meal. Patient not taking: Reported on 06/04/2017 05/24/17   Leonie Douglas, PA-C    Family  History Family History  Problem Relation Age of Onset  . Diabetes Maternal Grandmother     Social History Social History   Tobacco Use  . Smoking status: Never Smoker  . Smokeless tobacco: Never Used  Substance Use Topics  . Alcohol use: Yes    Alcohol/week: 1.8 oz    Types: 3 Cans of beer per week  . Drug use: No     Allergies   Patient has no known allergies.   Review of Systems Review of Systems  Constitutional: Negative for appetite change, chills, fatigue, fever and unexpected weight change.  Respiratory: Negative.   Cardiovascular: Negative.   Gastrointestinal: Negative for abdominal pain, constipation, diarrhea, nausea and vomiting.  Endocrine: Negative.   Genitourinary: Positive for flank pain and hematuria. Negative for decreased urine volume, difficulty urinating and urgency.  Skin: Negative for color change.  Neurological: Negative.   Hematological: Negative.      Physical Exam Updated Vital Signs BP (!) 163/100 (BP Location: Left Arm)   Pulse 89   Temp 98.2 F (36.8 C) (Oral)   Resp 18   Ht _0  (1.778 m)   Wt (!) 147.4 kg (325 lb)   SpO2 96%   BMI 46.63 kg/m   Physical Exam  Constitutional: He appears well-developed and well-nourished. He does not have a sickly appearance. No distress.  HENT:  Head: Normocephalic and atraumatic.  Eyes: Pupils are equal, round, and reactive to light. Conjunctivae, EOM and lids are normal. No scleral icterus.  Cardiovascular: Normal rate, regular rhythm and normal heart sounds.  No murmur heard. Pulmonary/Chest: Effort normal and breath sounds normal.  Abdominal: Soft. Normal appearance and bowel sounds are normal. He exhibits no fluid wave and no ascites. There is no hepatosplenomegaly. There is no tenderness. There is no CVA tenderness.  Musculoskeletal: Normal range of motion.  Neurological: He is alert. He has normal strength. No sensory deficit. He exhibits normal muscle tone.  Skin: Skin is warm.  Capillary refill takes less than 2 seconds. He is not diaphoretic. No pallor.  Not jaundice  Nursing note and vitals reviewed.    ED Treatments / Results  Labs (all labs ordered are listed, but only abnormal results are displayed) Labs Reviewed  URINALYSIS, ROUTINE W REFLEX MICROSCOPIC - Abnormal; Notable for the following components:      Result Value   Color, Urine AMBER (*)    APPearance CLOUDY (*)    Hgb urine dipstick LARGE (*)    Protein, ur 100 (*)    RBC / HPF >50 (*)    All other components within normal limits  BASIC METABOLIC PANEL - Abnormal; Notable for the following components:   Glucose, Bld 104 (*)    All other components within normal limits  CBC - Abnormal; Notable for the following components:   WBC 11.7 (*)    All other components within normal limits  LIPASE, BLOOD  HEPATIC FUNCTION PANEL    EKG None  Radiology Ct Renal Stone Study  Result Date: 10/20/2017 CLINICAL DATA:  Right flank pain for 7 hours. EXAM: CT ABDOMEN AND PELVIS WITHOUT CONTRAST TECHNIQUE: Multidetector CT imaging of the abdomen and pelvis was performed following the standard protocol without IV contrast. COMPARISON:  May 31, 2017 FINDINGS: Lower chest: No acute abnormality. Hepatobiliary: Diffuse low density liver without vessel displacement is identified. No focal liver lesions identified. The gallbladder is normal. The biliary tree is normal. Pancreas: In the tail the pancreas, there is a 2 x 2.3 cm solid mass. No surrounding inflammatory change is identified. Spleen: Normal in size without focal abnormality. Adrenals/Urinary Tract: The adrenal glands are normal. There is a stone in a calyx of lower pole right kidney measuring 7 mm. There is mild enlargement the right renal pelvis. No focal discrete obstructing stone is identified right ureter or ureteropelvic junction. The left kidney is normal. The bladder is normal. Stomach/Bowel: Stomach is within normal limits. Appendix appears normal.  No evidence of bowel wall thickening, distention, or inflammatory changes. Vascular/Lymphatic: No significant vascular findings are present. No enlarged abdominal or pelvic lymph nodes. Reproductive: Prostate is unremarkable. Other: No abdominal wall hernia or abnormality. No abdominopelvic ascites. Musculoskeletal: Degenerative joint changes of the spine are noted. IMPRESSION: There is a stone in a calyx of lower pole right kidney measuring 7 mm. There is mild enlargement the right renal pelvis. No focal discrete obstructing stone is identified right ureter or ureteropelvic junction. Fatty infiltration of liver. 2 x 2.3 cm solid mass pancreatic tail. Further evaluation with MRI recommended. Electronically Signed   By: Abelardo Diesel M.D.   On: 10/20/2017 14:48    Procedures Procedures (including critical care time)  Medications Ordered in ED Medications - No data to display   Initial Impression / Assessment and Plan / ED Course  Triage vital signs and the nursing notes have been reviewed.  Pertinent labs & imaging results that were available during care of the patient were reviewed and considered in medical decision making (see chart for details).  Patient presents with right flank pain and a history concerning for nephrolithiasis. He is afebrile with stable vital signs. Pain is currently controlled. Patient's physical exam is unremarkable and there is no CVA tenderness. There are no s/s to suggest an acute intra-abdominal process. Will order CT stone study to evaluate for nephrolithiasis which is likely especially given that his last CT in 05/2017 showed a calculi in the right kidney.  Clinical Course as of Oct 20 1548  Wed Oct 20, 2017  1305 Prelim labs unremarkable. Mildly elevated WBC indicative of acute stress reaction.   [GM]  1426 Many RBCs seen in urine leads diagnosis towards an active nephrolithiasis.   [GM]  1561 CT stone shows calculi in right kidney calyx with enlargement of renal  pelvis, but no calculi in ureters or signs of obstruction.  New pancreatic mass seen in pancreatic tail that was not on CT in 05/2017. Patient has no other physical complaints besides right flank pain. Will order lipase and hepatic function for further evaluation. Plan to consult oncology to discuss new mass.   [GM]  1537 Lipase and hepatic function panel normal. No sign of hepatobiliary obstruction.   Case discussed with on-call oncologist, Dr. Alen Blew. Given pt's labs, CT scan and presentation today, patient does not require any additional evaluation in the emergency department. The recommendation is to send an urgent referral to GI for biopsy and endoscopy who will then send the patient to an oncologist.   [GM]    Clinical Course User Index [GM] Chasin Findling, Jonelle Sports, PA-C   Final Clinical Impressions(s) / ED Diagnoses   Dispo: Home. After thorough clinical evaluation, this patient is determined to be medically stable and can be safely discharged with the previously mentioned treatment and/or outpatient follow-up/referral(s). At this time, there are no other apparent medical conditions that require further screening, evaluation or treatment.   Final diagnoses:  Right flank pain  Pancreatic mass    ED Discharge Orders        Ordered    Ambulatory referral to Gastroenterology     10/20/17 [address] 10/20/17 1551    Tegeler, Gwenyth Allegra, MD 10/20/17 1745

## 2017-10-20 NOTE — Discharge Instructions (Addendum)
The kidney stone is still in the kidney. There is no obstruction or evidence that the stone is actively passing.   I have spoken with our oncologist about the pancreatic mass seen on the CT scan. Per his recommendation, I will be referring you to a GI specialist who will likely do an endoscopy and a biopsy.  I am sorry to deliver this news today. I wish you the best in your care!

## 2017-10-21 ENCOUNTER — Other Ambulatory Visit: Payer: Self-pay

## 2017-10-21 ENCOUNTER — Telehealth: Payer: Self-pay | Admitting: Gastroenterology

## 2017-10-21 DIAGNOSIS — K8689 Other specified diseases of pancreas: Secondary | ICD-10-CM

## 2017-10-21 NOTE — Telephone Encounter (Signed)
Patient calling to check status of referral. Patient requesting a call to schedule or just be notified of what's going on.

## 2017-10-21 NOTE — Telephone Encounter (Signed)
Dr Christella HartiganJacobs please review records, it looks like the radiologist recommends MRI.  Please advise

## 2017-10-21 NOTE — Telephone Encounter (Signed)
Looks like this is incidental, doubt it was causing his severe R flank pain.  He needs first available upper EUS, MAC spot with either myself or Dr. Rush Landmark for pancreatic tail mass.  He does not need MRI prior.  Thanks   Wynetta Fines

## 2017-10-21 NOTE — Telephone Encounter (Signed)
EUS scheduled for 10/28/17 at 830 am, pt instructed and medications reviewed.  Patient to call with any questions or concerns.

## 2017-10-21 NOTE — Telephone Encounter (Signed)
Thanks for update

## 2017-10-28 ENCOUNTER — Ambulatory Visit (HOSPITAL_COMMUNITY): Payer: 59 | Admitting: Anesthesiology

## 2017-10-28 ENCOUNTER — Other Ambulatory Visit: Payer: Self-pay

## 2017-10-28 ENCOUNTER — Encounter (HOSPITAL_COMMUNITY): Payer: Self-pay

## 2017-10-28 ENCOUNTER — Encounter (HOSPITAL_COMMUNITY)
Admission: RE | Disposition: A | Discharge status: Home / Self Care | Payer: Self-pay | Source: Ambulatory Visit | Attending: Gastroenterology

## 2017-10-28 ENCOUNTER — Ambulatory Visit (HOSPITAL_COMMUNITY)
Admission: RE | Admit: 2017-10-28 | Discharge: 2017-10-28 | Disposition: A | Discharge status: Home / Self Care | Payer: 59 | Source: Ambulatory Visit | Attending: Gastroenterology | Admitting: Gastroenterology

## 2017-10-28 DIAGNOSIS — I10 Essential (primary) hypertension: Secondary | ICD-10-CM | POA: Diagnosis not present

## 2017-10-28 DIAGNOSIS — K869 Disease of pancreas, unspecified: Secondary | ICD-10-CM | POA: Diagnosis not present

## 2017-10-28 DIAGNOSIS — K3189 Other diseases of stomach and duodenum: Secondary | ICD-10-CM | POA: Diagnosis not present

## 2017-10-28 DIAGNOSIS — G4730 Sleep apnea, unspecified: Secondary | ICD-10-CM | POA: Insufficient documentation

## 2017-10-28 DIAGNOSIS — K219 Gastro-esophageal reflux disease without esophagitis: Secondary | ICD-10-CM | POA: Insufficient documentation

## 2017-10-28 DIAGNOSIS — I899 Noninfective disorder of lymphatic vessels and lymph nodes, unspecified: Secondary | ICD-10-CM | POA: Diagnosis not present

## 2017-10-28 DIAGNOSIS — K228 Other specified diseases of esophagus: Secondary | ICD-10-CM

## 2017-10-28 DIAGNOSIS — K8689 Other specified diseases of pancreas: Secondary | ICD-10-CM | POA: Diagnosis not present

## 2017-10-28 DIAGNOSIS — R935 Abnormal findings on diagnostic imaging of other abdominal regions, including retroperitoneum: Secondary | ICD-10-CM | POA: Diagnosis not present

## 2017-10-28 DIAGNOSIS — Z6841 Body Mass Index (BMI) 40.0 and over, adult: Secondary | ICD-10-CM | POA: Diagnosis not present

## 2017-10-28 DIAGNOSIS — R933 Abnormal findings on diagnostic imaging of other parts of digestive tract: Secondary | ICD-10-CM

## 2017-10-28 HISTORY — PX: FINE NEEDLE ASPIRATION: SHX6590

## 2017-10-28 HISTORY — PX: ESOPHAGOGASTRODUODENOSCOPY (EGD) WITH PROPOFOL: SHX5813

## 2017-10-28 HISTORY — PX: EUS: SHX5427

## 2017-10-28 HISTORY — PX: BIOPSY: SHX5522

## 2017-10-28 SURGERY — UPPER ENDOSCOPIC ULTRASOUND (EUS) RADIAL
Anesthesia: Monitor Anesthesia Care

## 2017-10-28 MED ORDER — PROPOFOL 10 MG/ML IV BOLUS
INTRAVENOUS | Status: AC
  Filled 2017-10-28: qty 20

## 2017-10-28 MED ORDER — PROPOFOL 500 MG/50ML IV EMUL
INTRAVENOUS | Status: DC | PRN
  Administered 2017-10-28: 300 ug/kg/min via INTRAVENOUS

## 2017-10-28 MED ORDER — LABETALOL HCL 5 MG/ML IV SOLN
INTRAVENOUS | Status: DC | PRN
  Administered 2017-10-28: 5 mg via INTRAVENOUS
  Administered 2017-10-28 (×2): 2.5 mg via INTRAVENOUS

## 2017-10-28 MED ORDER — SODIUM CHLORIDE 0.9 % IV SOLN: INTRAVENOUS | Status: DC

## 2017-10-28 MED ORDER — PROPOFOL 10 MG/ML IV BOLUS
INTRAVENOUS | Status: AC
  Filled 2017-10-28: qty 80

## 2017-10-28 MED ORDER — LACTATED RINGERS IV SOLN
INTRAVENOUS | Status: DC
  Administered 2017-10-28: 1000 mL via INTRAVENOUS
  Administered 2017-10-28: 10:00:00 via INTRAVENOUS

## 2017-10-28 MED ORDER — PROPOFOL 10 MG/ML IV BOLUS
INTRAVENOUS | Status: AC
  Filled 2017-10-28: qty 60

## 2017-10-28 MED ORDER — LIDOCAINE HCL (CARDIAC) PF 100 MG/5ML IV SOSY
PREFILLED_SYRINGE | INTRAVENOUS | Status: DC | PRN
  Administered 2017-10-28: 100 mg via INTRAVENOUS

## 2017-10-28 MED ORDER — GLYCOPYRROLATE 0.2 MG/ML IJ SOLN
INTRAMUSCULAR | Status: DC | PRN
  Administered 2017-10-28: .1 mg via INTRAVENOUS

## 2017-10-28 MED ORDER — PROPOFOL 10 MG/ML IV BOLUS
INTRAVENOUS | Status: AC
  Filled 2017-10-28: qty 40

## 2017-10-28 SURGICAL SUPPLY — 14 items

## 2017-10-28 NOTE — Anesthesia Preprocedure Evaluation (Signed)
Anesthesia Evaluation  Patient identified by MRN, date of birth, ID band Patient awake    Reviewed: Allergy & Precautions, Patient's Chart, lab work & pertinent test results  Airway Mallampati: I       Dental no notable dental hx. (+) Teeth Intact   Pulmonary sleep apnea ,  STOP-BANG score > 4   Pulmonary exam normal breath sounds clear to auscultation       Cardiovascular hypertension, Pt. on medications Normal cardiovascular exam Rhythm:Regular Rate:Normal     Neuro/Psych negative neurological ROS  negative psych ROS   GI/Hepatic GERD  Medicated,  Endo/Other  Morbid obesity  Renal/GU      Musculoskeletal   Abdominal (+) + obese,   Peds  Hematology   Anesthesia Other Findings   Reproductive/Obstetrics                             Anesthesia Physical Anesthesia Plan  ASA: III  Anesthesia Plan: MAC   Post-op Pain Management:    Induction:   PONV Risk Score and Plan: 1  Airway Management Planned: Natural Airway, Nasal Cannula and Mask  Additional Equipment:   Intra-op Plan:   Post-operative Plan:   Informed Consent: I have reviewed the patients History and Physical, chart, labs and discussed the procedure including the risks, benefits and alternatives for the proposed anesthesia with the patient or authorized representative who has indicated his/her understanding and acceptance.   Dental advisory given  Plan Discussed with: CRNA  Anesthesia Plan Comments:         Anesthesia Quick Evaluation

## 2017-10-28 NOTE — Anesthesia Procedure Notes (Signed)
Procedure Name: MAC Date/Time: 10/28/2017 8:25 AM Performed by: Lissa Morales, CRNA Pre-anesthesia Checklist: Patient identified, Emergency Drugs available, Suction available, Patient being monitored and Timeout performed Patient Re-evaluated:Patient Re-evaluated prior to induction Oxygen Delivery Method: Simple face mask Placement Confirmation: positive ETCO2

## 2017-10-28 NOTE — Anesthesia Postprocedure Evaluation (Signed)
Anesthesia Post Note  Patient: Anthony Edwards  Procedure(s) Performed: UPPER ENDOSCOPIC ULTRASOUND (EUS) RADIAL (N/A ) ESOPHAGOGASTRODUODENOSCOPY (EGD) WITH PROPOFOL (N/A ) BIOPSY     Patient location during evaluation: Endoscopy Anesthesia Type: MAC Level of consciousness: awake and sedated Pain management: pain level controlled Vital Signs Assessment: post-procedure vital signs reviewed and stable Respiratory status: spontaneous breathing Cardiovascular status: stable Postop Assessment: no apparent nausea or vomiting Anesthetic complications: no    Last Vitals:  Vitals:   10/28/17 1030 10/28/17 1040  BP: 140/89 (!) 157/74  Pulse: 80 75  Resp: 18 (!) 23  Temp:    SpO2: 97% 98%    Last Pain:  Vitals:   10/28/17 1040  TempSrc:   PainSc: 0-No pain   Pain Goal:                 Rickie Gutierres JR,JOHN Mckynzi Cammon

## 2017-10-28 NOTE — Discharge Instructions (Signed)
YOU HAD AN ENDOSCOPIC PROCEDURE TODAY: Refer to the procedure report and other information in the discharge instructions given to you for any specific questions about what was found during the examination. If this information does not answer your questions, please call [facility] office at [phone] to clarify.  ° °YOU SHOULD EXPECT: Some feelings of bloating in the abdomen. Passage of more gas than usual. Walking can help get rid of the air that was put into your GI tract during the procedure and reduce the bloating. If you had a lower endoscopy (such as a colonoscopy or flexible sigmoidoscopy) you may notice spotting of blood in your stool or on the toilet paper. Some abdominal soreness may be present for a day or two, also. ° °DIET: Your first meal following the procedure should be a light meal and then it is ok to progress to your normal diet. A half-sandwich or bowl of soup is an example of a good first meal. Heavy or fried foods are harder to digest and may make you feel nauseous or bloated. Drink plenty of fluids but you should avoid alcoholic beverages for 24 hours. If you had a esophageal dilation, please see attached instructions for diet.   ° °ACTIVITY: Your care partner should take you home directly after the procedure. You should plan to take it easy, moving slowly for the rest of the day. You can resume normal activity the day after the procedure however YOU SHOULD NOT DRIVE, use power tools, machinery or perform tasks that involve climbing or major physical exertion for 24 hours (because of the sedation medicines used during the test).  ° °SYMPTOMS TO REPORT IMMEDIATELY: °A gastroenterologist can be reached at any hour. Please call [phone]  for any of the following symptoms:  °Following lower endoscopy (colonoscopy, flexible sigmoidoscopy) °Excessive amounts of blood in the stool  °Significant tenderness, worsening of abdominal pains  °Swelling of the abdomen that is new, acute  °Fever of 100° or  higher  °Following upper endoscopy (EGD, EUS, ERCP, esophageal dilation) °Vomiting of blood or coffee ground material  °New, significant abdominal pain  °New, significant chest pain or pain under the shoulder blades  °Painful or persistently difficult swallowing  °New shortness of breath  °Black, tarry-looking or red, bloody stools ° °FOLLOW UP:  °If any biopsies were taken you will be contacted by phone or by letter within the next 1-3 weeks. Call [phone]  if you have not heard about the biopsies in 3 weeks.  °Please also call with any specific questions about appointments or follow up tests. ° °

## 2017-10-28 NOTE — Transfer of Care (Signed)
Immediate Anesthesia Transfer of Care Note  Patient: Anthony Edwards  Procedure(s) Performed: UPPER ENDOSCOPIC ULTRASOUND (EUS) RADIAL (N/A ) ESOPHAGOGASTRODUODENOSCOPY (EGD) WITH PROPOFOL (N/A ) BIOPSY  Patient Location: PACU  Anesthesia Type:MAC  Level of Consciousness: awake, alert , oriented and patient cooperative  Airway & Oxygen Therapy: Patient Spontanous Breathing and Patient connected to nasal cannula oxygen  Post-op Assessment: Report given to RN, Post -op Vital signs reviewed and stable and Patient moving all extremities X 4  Post vital signs: stable  Last Vitals:  Vitals Value Taken Time  BP 133/82 10/28/2017 10:25 AM  Temp 36.6 C 10/28/2017 10:25 AM  Pulse 80 10/28/2017 10:29 AM  Resp 18 10/28/2017 10:29 AM  SpO2 97 % 10/28/2017 10:29 AM  Vitals shown include unvalidated device data.  Last Pain:  Vitals:   10/28/17 1025  TempSrc: Oral  PainSc: 0-No pain         Complications: No apparent anesthesia complications

## 2017-10-28 NOTE — H&P (Signed)
Anthony Edwards is an 32 y.o. male.   Chief Complaint: Abnormal imaging study concern for pancreatic lesion HPI: Patient initially presented to ED earlier this month with abdominal pain that was sharp and stabbing in the Right flank/back.  He was concerned about a kidney stone.  Patient had CT without contrast that showed right sided nephrolithiasis and also a possible solid lesion in the pancreatic tail.  He is here for a diagnostic EUS.  He no longer has any abdominal pain (subsided before leaving the ED).  Past Medical History:  Diagnosis Date  . Hypertension   . Renal disorder     History reviewed. No pertinent surgical history.  Family History  Problem Relation Age of Onset  . Diabetes Maternal Grandmother   . Pancreatic cancer Neg Hx   . Pancreatitis Neg Hx   . Pancreatic disease Neg Hx   . Liver disease Neg Hx   . Gallbladder disease Neg Hx   . Colon cancer Neg Hx    Social History:  reports that he has never smoked. He has never used smokeless tobacco. He reports that he drinks about 3.0 standard drinks of alcohol per week. He reports that he does not use drugs.  Allergies: No Known Allergies  Medications Prior to Admission  Medication Sig Dispense Refill  . ibuprofen (ADVIL,MOTRIN) 200 MG tablet Take 200-600 mg by mouth daily as needed for moderate pain.    Marland Kitchen omeprazole (PRILOSEC) 20 MG capsule Take 20 mg by mouth daily as needed (stomach).    . Blood Pressure Monitoring (BLOOD PRESSURE MONITOR DELUXE) KIT 1 Units by Does not apply route daily. 1 kit 0  . lisinopril (PRINIVIL,ZESTRIL) 20 MG tablet Take 1 tablet (20 mg total) by mouth daily. (Patient not taking: Reported on 10/20/2017) 90 tablet 0  . naproxen (NAPROSYN) 500 MG tablet Take 1 tablet (500 mg total) by mouth 2 (two) times daily with a meal. (Patient not taking: Reported on 06/04/2017) 30 tablet 0    No results found for this or any previous visit (from the past 48 hour(s)). No results found.  ROS  Blood  pressure (!) 177/103, pulse 90, temperature 98.1 F (36.7 C), temperature source Oral, resp. rate (!) 25, height 5' 10" (1.778 m), weight (!) 147.4 kg, SpO2 96 %. Physical Exam  Gen: NAD, resting in bed with GF and mother HEENT: MMM, no scleral icterus CV: RR without R/Gs Pulm: No adventitious sounds present Abd: NABS, soft, rounded, obese, NT/ND Ext: No LE edema  Assessment/Plan This is a 32 y.o. male who presents for direct to procedure EUS to further evaluate a finding on recent non-contrast CT of a pancreatic lesion/mass.  His pain, on this short discussion can most likely be attributed to nephrolithiasis but this incidental finding does require further workup.  I personally reviewed the imaging and discussed the imaging findings with Radiology to see if they saw the changes as well, and there has been some changes from the last 2 CTs but not clear exactly what this is.  The risks of EUS including bleeding, infection, aspiration pneumonia and intestinal perforation were discussed as was the possibility it may not give a definitive diagnosis.  If a biopsy of the pancreas is done as part of the EUS, there is an additional risk of pancreatitis at the rate of about 1%.  It was explained that procedure related pancreatitis is typically mild, although can be severe and even life threatening, which is why we do not perform random pancreatic  biopsies and only biopsy a lesion we feel is concerning enough to warrant the risk.  The risks and benefits of endoscopic evaluation were discussed with the patient; these include but are not limited to the risk of perforation, infection, bleeding, missed lesions, lack of diagnosis, severe illness requiring hospitalization, as well as anesthesia and sedation related illnesses.  The patient is agreeable to proceed.   Irving Copas, MD 10/28/2017, 8:16 AM

## 2017-10-28 NOTE — Op Note (Signed)
[facility] Patient Name: Anthony Edwards Procedure Date: 10/28/2017 MRN: [id number] Attending MD: Justice Britain , MD Date of Birth: [birth year] CSN: [id number] Age: 32 Admit Type: Outpatient Procedure:                Upper EUS Indications:              Suspected mass in pancreas on CT scan, Abdominal                            pain in the right upper quadrant Providers:                Justice Britain, MD, Zenon Mayo, RN, William Dalton, Technician, Enrigue Catena, CRNA Referring MD:             Reather Laurence. Izora Gala, MD Medicines:                Monitored Anesthesia Care Complications:            No immediate complications. Estimated Blood Loss:     Estimated blood loss was minimal. Procedure:                Pre-Anesthesia Assessment:                           - Prior to the procedure, a History and Physical                            was performed, and patient medications and                            allergies were reviewed. The patient's tolerance of                            previous anesthesia was also reviewed. The risks                            and benefits of the procedure and the sedation                            options and risks were discussed with the patient.                            All questions were answered, and informed consent                            was obtained. Prior Anticoagulants: The patient has                            taken previous NSAID medication. ASA Grade                            Assessment: II - A patient with mild systemic  disease. After reviewing the risks and benefits,                            the patient was deemed in satisfactory condition to                            undergo the procedure.                           After obtaining informed consent, the endoscope was                            passed under direct vision. Throughout the                         procedure, the patient's blood pressure, pulse, and                            oxygen saturations were monitored continuously. The                            [id number]-[id number] ([id number]) Olympus adult endoscope was                            introduced through the mouth, and advanced to the                            second part of duodenum. The GF-[id number]-[id number] ([id number])                            Olympus Radial EUS was introduced through the                            mouth, and advanced to the duodenum for ultrasound                            examination from the stomach and duodenum. The                            upper EUS was technically difficult and complex.                            The patient tolerated the procedure. Scope In: Scope Out: Findings:      ENDOSCOPIC FINDING: :      No gross lesions were noted in the entire esophagus.      The Z-line was irregular and was found 39 cm from the incisors.      No gross lesions were noted in the entire examined stomach.      The duodenal bulb, first portion of the duodenum and second portion of       the duodenum were normal.      ENDOSONOGRAPHIC FINDING: :      An oval, hyperechoic lesion was identified in the pancreatic tail. The       lesion measured 22 mm by 16 mm in maximal cross-sectional diameter.  The       outer margins were smooth. An intact interface was seen between the mass       and the celiac trunk suggesting a lack of invasion. The remainder of the       pancreas was examined, however, there is fattiness of the parenchyma       that makes overall visualization difficult. The downstream pancreatic       duct in the body measured 1 mm. There were no ductal or parenchymal       calcifications noted. Fine needle biopsy was performed. Color Doppler       imaging was utilized prior to needle puncture to confirm a lack of       significant vascular structures within the needle path. Four passes were       made with  the 25 gauge Acquire ultrasound core biopsy needle using a       transgastric approach. Visible cores of tissue were obtained.       Preliminary cytologic examination and touch preps were performed. Final       cytology results are pending.      Endosonographic imaging in the visualized portion of the liver showed no       mass-lesion.      Two benign-appearing lymph nodes were visualized in the peripancreatic       region. The largest measured 8.3 mm by 5.3 mm in maximal cross-sectional       diameter and the other 2.4 mm by 4.6 mm. The nodes were round, isoechoic       and had well defined margins.      The celiac region was visualized. Impression:               EGD Impression:                           - No gross lesions in esophagus.                           - Z-line irregular, 39 cm from the incisors.                           - No gross lesions in the stomach.                           - Normal duodenal bulb, first portion of the                            duodenum and second portion of the duodenum.                           EUS Impression:                           - A hyperechoic lesion was identified in the                            pancreatic tail. Tissue was obtained from this                            exam, and results  are pending. However, the                            endosonographic appearance is suggestive of either                            benign fatty changes though a neuroendocrine lesion                            could also have similar texture. Fine needle biopsy                            performed to elucidate etiology.                           - Two benign lymph nodes were visualized in the                            peripancreatic region. Tissue has not been                            obtained. However, the endosonographic appearance                            is consistent with benign inflammatory changes. Moderate Sedation:      N/A- Per Anesthesia  Care Recommendation:           - The patient will be observed post-procedure,                            until all discharge criteria are met.                           - Discharge patient to home.                           - Patient has a contact number available for                            emergencies. The signs and symptoms of potential                            delayed complications were discussed with the                            patient. Return to normal activities tomorrow.                            Written discharge instructions were provided to the                            patient.                           - Observe patient's clinical course.                           -  Monitor for signs/symptoms of                            perforation/bleeding/infection/pancreatitis.                           - Await cytology results.                           - Dependent on final results of the FNB, will                            consider role of contrasted cross-sectional imaging                            MRI-Abdomen vs CT-Abdomen Pancreas Protocol if only                            benign changes are found and/or inconclusive.                           - The findings and recommendations were discussed                            with the patient.                           - The findings and recommendations were discussed                            with the designated responsible adults. Procedure Code(s):        --- Professional ---                           [phone], Esophagogastroduodenoscopy, flexible,                            transoral; with transendoscopic ultrasound-guided                            intramural or transmural fine needle                            aspiration/biopsy(s), (includes endoscopic                            ultrasound examination limited to the esophagus,                            stomach or duodenum, and adjacent structures) Diagnosis Code(s):         --- Professional ---                           K22.8, Other specified diseases of esophagus                           K86.89, Other specified diseases  of pancreas                           I89.9, Noninfective disorder of lymphatic vessels                            and lymph nodes, unspecified                           R93.3, Abnormal findings on diagnostic imaging of                            other parts of digestive tract                           R10.11, Right upper quadrant pain CPT copyright 2017 American Medical Association. All rights reserved. The codes documented in this report are preliminary and upon coder review may  be revised to meet current compliance requirements. Justice Britain, MD 10/28/2017 10:27:37 AM Number of Addenda: 0

## 2017-10-29 ENCOUNTER — Encounter (HOSPITAL_COMMUNITY): Payer: Self-pay | Admitting: Gastroenterology

## 2017-11-02 ENCOUNTER — Ambulatory Visit: Payer: 59 | Admitting: Physician Assistant

## 2017-11-04 ENCOUNTER — Telehealth: Payer: Self-pay

## 2017-11-04 ENCOUNTER — Telehealth: Payer: Self-pay | Admitting: Gastroenterology

## 2017-11-04 DIAGNOSIS — K8689 Other specified diseases of pancreas: Secondary | ICD-10-CM

## 2017-11-04 DIAGNOSIS — R935 Abnormal findings on diagnostic imaging of other abdominal regions, including retroperitoneum: Secondary | ICD-10-CM

## 2017-11-04 NOTE — Addendum Note (Signed)
Addended by: Corliss ParishMANSOURATY, GABRIEL on: 11/04/2017 08:11 AM   Modules accepted: Orders

## 2017-11-04 NOTE — Telephone Encounter (Signed)
-----   Message from Lemar LoftyGabriel Mansouraty Jr., MD sent at 11/04/2017  8:09 AM EDT ----- Regarding: Follow up Dear Alexia FreestonePatty, I would like to have this patient come in 4-weeks for a CT-Pancreas protocol abdomen (I have placed an order for it). Clinic visit 1-2 days later to discuss results and possible role of repeat EUS. He is aware of the EUS FNB results from last week. Thank you.  Liz BeachGabe

## 2017-11-04 NOTE — Telephone Encounter (Signed)
Able to get a hold of patient this morning. Alerted him to results of the EUS with FNB. We did discuss that there was some slight atypia in the ductular cells. We will plan a 4-week interval CT-Pancreas protocol abdomen to re-evaluate and see if any changes are noted that would require a potential repeat EUS with FNB. Patient agrees to this plan of action. We will set up with next day or two clinic visit to discuss results and finalize plan of action thereafter. He is appreciative for call and results and agrees with plan of action.  Corliss ParishGabriel Mansouraty, MD [facility] Office # [phone]

## 2017-11-04 NOTE — Telephone Encounter (Signed)
appt with Dr Judie PetitM on 12/03/17 at 1030 am Left message on machine to call back

## 2017-11-04 NOTE — Telephone Encounter (Signed)
Called and left a VM for patient about the results of EUS having returned. If he calls the office, my office should send me a page and I'll try and reach him throughout the course of the day. I'll reattempt callback this afternoon either way.  Corliss ParishGabriel Mansouraty, MD [facility] Office # [phone]

## 2017-11-04 NOTE — Telephone Encounter (Signed)
Left message on machine to call back        You have been scheduled for a CT scan of the abdomen and pelvis at [facility] ([address]---this is in the same building as [facility]).   You are scheduled on 12/02/17 at 9 am. You should arrive 30 minutes prior to your appointment time for registration. Please follow the written instructions below on the day of your exam:  WARNING: IF YOU ARE ALLERGIC TO IODINE/X-RAY DYE, PLEASE NOTIFY RADIOLOGY IMMEDIATELY AT [phone]! YOU WILL BE GIVEN A 13 HOUR PREMEDICATION PREP.  1) Do not eat or drink anything after 5 am (4 hours prior to your test)  You may take any medications as prescribed with a small amount of water except for the following: Metformin, Glucophage, Glucovance, Avandamet, Riomet, Fortamet, Actoplus Met, Janumet, Glumetza or Metaglip. The above medications must be held the day of the exam AND 48 hours after the exam.  Plan on being at [facility] for 30 minutes or longer, depending on the type of exam you are having performed.  This test typically takes 30-45 minutes to complete.  If you have any questions regarding your exam or if you need to reschedule, you may call the CT department at [phone] between the hours of 8:00 am and 5:00 pm, Monday-Friday.  _____________________________________________________

## 2017-11-05 NOTE — Telephone Encounter (Signed)
Left message on machine to call back  

## 2017-11-08 ENCOUNTER — Encounter: Payer: Self-pay | Admitting: Gastroenterology

## 2017-11-08 NOTE — Telephone Encounter (Signed)
Left message on machine to call back I have been unable to reach the pt.  Letter has been mailed with the information.

## 2017-11-11 NOTE — Progress Notes (Signed)
MRN: [id number] DOB: [birth year]  Subjective:   Anthony Edwards is a 32 y.o. male presenting for follow up on Hypertension. Has been out of medication for a few months, he is not sure why he did not come back for refills. Was taking lisinopril 20 mg daily. Notes he did have a couple dizzy episodes after starting the medication so assumed his blood pressure was too low. However, patient is checking blood pressure at home, range is 118-867R systolic most of the time but can be as high as 373-668 systolically after work. Has never had a low bp reading when he feels that dizzy sensation. Has not tried any other bp medications in the past. He is asx today. Denies lightheadedness, dizziness, chronic headache, double vision, chest pain, shortness of breath, heart racing, palpitations, nausea, vomiting, abdominal pain, hematuria, lower leg swelling. Lifestyle: Avoiding excessive salt intake. Pretty varied diet, eats vegetarian a couple times a week. Always walking at work. Denies smoking, social alcohol. No PMH of gout.   Of note, pt reports nightly snoring, it has gotten louder and more often.  Girlfriend has witnessed apenic episodes and told him he needs. He has daytime somnolence and fatigue, but thinks this is related to his job. He has never been evaluated for OSA. He would like to have sleep study at this time as it affecting their relationship.  In terms of hematuria, pt never followed up with urology referral. Has not seen any gross blood.   Anthony Edwards has a current medication list which includes the following prescription(s): ibuprofen, blood pressure monitor deluxe, losartan-hydrochlorothiazide, and omeprazole. Also has No Known Allergies.  Anthony Edwards  has a past medical history of Hypertension and Renal disorder. Also  has a past surgical history that includes EUS (N/A, 10/28/2017); Esophagogastroduodenoscopy (egd) with propofol (N/A, 10/28/2017); biopsy (10/28/2017); and Fine needle aspiration (10/28/2017).   Objective:   Vitals: BP (!) 162/122 (BP Location: Right Arm, Patient Position: Sitting, Cuff Size: Large)   Pulse 88   Temp 98.2 F (36.8 C) (Oral)   Resp 16   Ht '5\' 10"'  (1.778 m)   Wt (!) 320 lb 12.8 oz (145.5 kg)   SpO2 95%   BMI 46.03 kg/m   Physical Exam  Constitutional: He is oriented to person, place, and time. He appears well-developed and well-nourished. No distress.  HENT:  Head: Normocephalic and atraumatic.  Mouth/Throat: Uvula is midline, oropharynx is clear and moist and mucous membranes are normal. No tonsillar exudate.  Eyes: Pupils are equal, round, and reactive to light. Conjunctivae and EOM are normal.  Neck: Normal range of motion.  Neck circumference measures 23 inches.   Cardiovascular: Normal rate, regular rhythm, normal heart sounds and intact distal pulses.  Pulmonary/Chest: Effort normal and breath sounds normal. He has no decreased breath sounds. He has no wheezes. He has no rhonchi. He has no rales.  Musculoskeletal:       Right lower leg: He exhibits no swelling.       Left lower leg: He exhibits no swelling.  Neurological: He is alert and oriented to person, place, and time.  Skin: Skin is warm and dry.  Psychiatric: He has a normal mood and affect.  Vitals reviewed.   No results found for this or any previous visit (from the past 24 hour(s)).  BP Readings from Last 3 Encounters:  11/12/17 (!) 162/122  10/28/17 (!) 157/74  10/20/17 (!) 168/116   Wt Readings from Last 3 Encounters:  11/12/17 (!) 320 lb  12.8 oz (145.5 kg)  10/28/17 (!) 324 lb 15.3 oz (147.4 kg)  10/20/17 (!) 325 lb (147.4 kg)    Assessment and Plan :  1. Essential hypertension Asymptomatic. Rec restarting bp medication at this time. Also discussed lifestyle modifications. Instructed to check bp outside of office over the next couple of weeks and document these values. Return in 4 weeks. Given strict ED precautions.  - Blood Pressure Monitoring (BLOOD PRESSURE MONITOR  DELUXE) KIT; 1 Units by Does not apply route daily.  Dispense: 1 kit; Refill: 0 - losartan-hydrochlorothiazide (HYZAAR) 50-12.5 MG tablet; Take 1 tablet by mouth daily.  Dispense: 30 tablet; Refill: 0 - Ambulatory referral to Neurology  2. Hematuria, unspecified type Will check urine micro to see if hematuria is persisting. If so, rec seeing urology. Referral was sent to [facility] but that was in 05/2017, rec pt contact them to see if he can schedule appointment, if they need new referral, it has been placed.  - Urinalysis, microscopic only - Ambulatory referral to Urology  3. Snores Epworth Sleepiness Scale score of 12. He is at risk for OSA. Rec sleep study.  - Ambulatory referral to Neurology 4. Morbid obesity due to excess calories [facility]) - Ambulatory referral to Neurology  5. Need for influenza vaccination - Flu Vaccine QUAD 36+ mos IM  Tenna Delaine, PA-C  [facility] 11/13/2017 9:10 PM

## 2017-11-12 ENCOUNTER — Ambulatory Visit (INDEPENDENT_AMBULATORY_CARE_PROVIDER_SITE_OTHER): Payer: 59 | Admitting: Physician Assistant

## 2017-11-12 ENCOUNTER — Encounter: Payer: Self-pay | Admitting: Physician Assistant

## 2017-11-12 ENCOUNTER — Other Ambulatory Visit: Payer: Self-pay

## 2017-11-12 VITALS — BP 162/122 | HR 88 | Temp 98.2°F | Resp 16 | Ht 70.0 in | Wt 320.8 lb

## 2017-11-12 DIAGNOSIS — R319 Hematuria, unspecified: Secondary | ICD-10-CM

## 2017-11-12 DIAGNOSIS — Z23 Encounter for immunization: Secondary | ICD-10-CM | POA: Diagnosis not present

## 2017-11-12 DIAGNOSIS — R0683 Snoring: Secondary | ICD-10-CM

## 2017-11-12 DIAGNOSIS — I10 Essential (primary) hypertension: Secondary | ICD-10-CM | POA: Diagnosis not present

## 2017-11-12 MED ORDER — BLOOD PRESSURE MONITOR DELUXE KIT: 1.0000 [IU] | PACK | Freq: Every day | 0 refills | Status: AC

## 2017-11-12 MED ORDER — LOSARTAN POTASSIUM-HCTZ 50-12.5 MG PO TABS: 1.0000 | ORAL_TABLET | Freq: Every day | ORAL | 0 refills | Status: DC

## 2017-11-12 NOTE — Patient Instructions (Addendum)
In terms of elevated blood pressure, I would like you to start medication daily. I would like you to check your blood pressure at least a couple times over the next week outside of the office and document these values. It is best if you check the blood pressure at different times in the day. Your goal is >100/60 and <140/90. If your values are consistently above this goal, please return to office for further evaluation. Otherwise, return in 4 weeks. If you start to have chest pain, blurred vision, shortness of breath, severe headache, lower leg swelling, or nausea/vomiting please seek care immediately here or at the ED.   If you still have blood in your urine, I would like you to contact [facility] to schedule appointment. I have replaced the referral.  [facility] Address: [address] [place] [facility], [place], [place] [address] Phone: [phone] If you have lab work done today you will be contacted with your lab results within the next 2 weeks.  If you have not heard from us then please contact us. The fastest way to get your results is to register for My Chart.   In terms of sleep study, neurology should contact you within 1-2 weeks.   IF you received an x-ray today, you will receive an invoice from [facility]. Please contact [facility] at [phone] with questions or concerns regarding your invoice.   IF you received labwork today, you will receive an invoice from [facility]. Please contact [facility] at [phone] with questions or concerns regarding your invoice.   Our billing staff will not be able to assist you with questions regarding bills from these companies.  You will be contacted with the lab results as soon as they are available. The fastest way to get your results is to activate your My Chart account. Instructions are located on the last page of this paperwork. If you have not heard from us regarding the  results in 2 weeks, please contact this office.

## 2017-11-29 ENCOUNTER — Telehealth: Payer: Self-pay

## 2017-11-29 DIAGNOSIS — K8689 Other specified diseases of pancreas: Secondary | ICD-10-CM

## 2017-11-29 NOTE — Telephone Encounter (Signed)
You have been scheduled for an MRI at [facility] on 12/02/17. Your appointment time is 7 pm . Please arrive 30 minutes prior to your appointment time for registration purposes. Please make certain not to have anything to eat or drink 4 hours prior to your test. In addition, if you have any metal in your body, have a pacemaker or defibrillator, please be sure to let your ordering physician know. This test typically takes 45 minutes to 1 hour to complete. Should you need to reschedule, please call [phone] to do so.  Left message on machine to call back

## 2017-11-29 NOTE — Telephone Encounter (Signed)
-----   Message from Lemar LoftyGabriel Mansouraty Jr., MD sent at 11/25/2017  5:16 PM EDT ----- Regarding: RE: order Amanee Iacovelli and Amy, Thank you for letting me know about this.  If there are issues with obtaining a pancreas protocol CT abdomen that I am okay with the patient obtaining an MRI/MRCP abdomen.  If you send me the order I am okay with that.  I just want to ensure that the patient's insurance does not have issues with that so let us make sure we get a prior authorization completed for it.  Thank you. Gabe ----- Message ----- From: Loretha StaplerPhelps, Naela Nodal L, RN Sent: 11/25/2017   2:06 PM EDT To: Tomasa RandAmy L Hazelwood, Gabriel Mansouraty Jr., MD Subject: RE: order                                      Dr Meridee ScoreMansouraty the insurance company will not cover a CT scan. Do you want an MRI? ----- Message ----- From: Libby MawHazelwood, Amy L Sent: 11/25/2017  12:51 PM EDT To: Loretha StaplerPatty L Shadasia Oldfield, RN Subject: RE: order                                      His insurance has just denied CT.  They are saying the information faxed to them meets clinical guidelines for an MRI abd with and without.  I was afraid that was going to happen. Do you want me to fax you what they sent?  ----- Message ----- From: Loretha StaplerPhelps, Berda Shelvin L, RN Sent: 11/23/2017   9:59 AM EDT To: Libby MawAmy L Hazelwood Subject: RE: order                                      He did want CT not MRI for pancreatic mass thanks  ----- Message ----- From: Libby MawHazelwood, Amy L Sent: 11/23/2017   9:47 AM EDT To: Loretha StaplerPatty L Stephanie Littman, RN Subject: order                                          Trula SladeHey Donnavan Covault, I need you to look back at some info on this pt.  Dr. Meridee ScoreMansouraty ordered a CT and you did order it and schedule, but the reason the test is being done is for abn CT.  If you look at the CT report, it states:  IMPRESSION: There is a stone in a calyx of lower pole right kidney measuring 7 mm. There is mild enlargement the right renal pelvis. No focal discrete obstructing stone is identified right  ureter or ureteropelvic junction.  Fatty infiltration of liver.  2 x 2.3 cm solid mass pancreatic tail. Further evaluation with MRI Recommended.   Please verify with him.  MRI seams to be what was recommended by radiologist.  Thanks, Amy

## 2017-11-30 NOTE — Telephone Encounter (Signed)
Left message on machine to call back  

## 2017-12-01 NOTE — Telephone Encounter (Signed)
Left message on machine to call back  

## 2017-12-02 ENCOUNTER — Ambulatory Visit (HOSPITAL_COMMUNITY): Admission: RE | Admit: 2017-12-02 | Payer: 59 | Source: Ambulatory Visit

## 2017-12-02 ENCOUNTER — Other Ambulatory Visit: Payer: 59

## 2017-12-02 NOTE — Telephone Encounter (Signed)
Left message on machine to call back  

## 2017-12-03 ENCOUNTER — Ambulatory Visit: Payer: 59 | Admitting: Gastroenterology

## 2017-12-15 ENCOUNTER — Other Ambulatory Visit: Payer: Self-pay | Admitting: Physician Assistant

## 2017-12-15 DIAGNOSIS — I10 Essential (primary) hypertension: Secondary | ICD-10-CM

## 2017-12-15 MED ORDER — LOSARTAN POTASSIUM-HCTZ 50-12.5 MG PO TABS: 1.0000 | ORAL_TABLET | Freq: Every day | ORAL | 0 refills | Status: DC

## 2017-12-20 ENCOUNTER — Encounter: Payer: Self-pay | Admitting: Physician Assistant

## 2017-12-20 ENCOUNTER — Other Ambulatory Visit: Payer: Self-pay

## 2017-12-20 ENCOUNTER — Ambulatory Visit (INDEPENDENT_AMBULATORY_CARE_PROVIDER_SITE_OTHER): Payer: 59 | Admitting: Physician Assistant

## 2017-12-20 VITALS — BP 144/86 | HR 92 | Temp 99.3°F | Resp 20 | Ht 69.06 in | Wt 332.2 lb

## 2017-12-20 DIAGNOSIS — I10 Essential (primary) hypertension: Secondary | ICD-10-CM

## 2017-12-20 DIAGNOSIS — R0683 Snoring: Secondary | ICD-10-CM | POA: Diagnosis not present

## 2017-12-20 DIAGNOSIS — R319 Hematuria, unspecified: Secondary | ICD-10-CM | POA: Diagnosis not present

## 2017-12-20 MED ORDER — LOSARTAN POTASSIUM-HCTZ 100-12.5 MG PO TABS: 1.0000 | ORAL_TABLET | Freq: Every day | ORAL | 0 refills | Status: DC

## 2017-12-20 NOTE — Progress Notes (Signed)
MRN: [id number] DOB: 30-Jun-1985  Subjective:   Anthony Edwards is a 32 y.o. male presenting for follow up on Hypertension. Restarted on losartan-hydrochlorothiazide (HYZAAR) 50-12.5 MG tablet at last visit. Referral for sleep study placed. Scheduled for Dec 2nd 2019. Today, pt reports he is tolerating medication well.  Patient is checking blood pressure at home, range is 197-588 systolic. Has not had any episodes of dizziness after starting the medication. Denies lightheadedness, dizziness, chronic headache, double vision, chest pain, shortness of breath, heart racing, palpitations, nausea, vomiting, abdominal pain, hematuria, lower leg swelling. Lifestyle: Reports unhealthy eating habits for the past month. Has been walking a little more. Of note, has not followed up with urology regarding microscopic hematuria.   Anthony Edwards has a current medication list which includes the following prescription(s): blood pressure monitor deluxe, ibuprofen, losartan-hydrochlorothiazide, and omeprazole. Also has No Known Allergies.  Anthony Edwards  has a past medical history of Hypertension and Renal disorder. Also  has a past surgical history that includes EUS (N/A, 10/28/2017); Esophagogastroduodenoscopy (egd) with propofol (N/A, 10/28/2017); biopsy (10/28/2017); and Fine needle aspiration (10/28/2017).   Objective:   Vitals: BP (!) 152/98   Pulse 92   Temp 99.3 F (37.4 C) (Oral)   Resp 20   Ht 5' 9.06" (1.754 m)   Wt (!) 332 lb 3.2 oz (150.7 kg)   SpO2 97%   BMI 48.98 kg/m   Physical Exam  Constitutional: He is oriented to person, place, and time. He appears well-developed and well-nourished. No distress.  HENT:  Head: Normocephalic and atraumatic.  Mouth/Throat: Uvula is midline, oropharynx is clear and moist and mucous membranes are normal. No tonsillar exudate.  Eyes: Pupils are equal, round, and reactive to light. Conjunctivae and EOM are normal.  Neck: Normal range of motion.  Cardiovascular: Normal rate,  regular rhythm, normal heart sounds and intact distal pulses.  Pulmonary/Chest: Effort normal and breath sounds normal. He has no decreased breath sounds. He has no wheezes. He has no rhonchi. He has no rales.  Musculoskeletal:       Right lower leg: He exhibits no swelling.       Left lower leg: He exhibits no swelling.  Neurological: He is alert and oriented to person, place, and time.  Skin: Skin is warm and dry.  Psychiatric: He has a normal mood and affect.  Vitals reviewed.    BP Readings from Last 3 Encounters:  12/20/17 (!) 144/86  11/12/17 (!) 162/122  10/28/17 (!) 157/74   Wt Readings from Last 3 Encounters:  12/20/17 (!) 332 lb 3.2 oz (150.7 kg)  11/12/17 (!) 320 lb 12.8 oz (145.5 kg)  10/28/17 (!) 324 lb 15.3 oz (147.4 kg)    No results found for this or any previous visit (from the past 24 hour(s)).  Assessment and Plan :  1. Essential hypertension Blood pressure is still not at goal but has improved since last visit.  He is asymptomatic.  Tolerating medication well.  Recommend increasing losartan aspect of medication.  Continue monitoring blood pressure at home.  If continues to stay >140/90 with increase in medication, recommend he take 2 tablets of the original prescription of losartan-HCTZ 50-12.5 mg to equal total of losartan-HCTZ 100- 25 mg daily.  If he has to increase to this dose, recommend he contact me after 2 weeks and let me know what his blood pressure readings are.  If >140/90, return to office.  If controlled, will provide refills and have patient follow-up in 3 months. -  CMP14+EGFR - losartan-hydrochlorothiazide (HYZAAR) 100-12.5 MG tablet; Take 1 tablet by mouth daily.  Dispense: 90 tablet; Refill: 0  2. Hematuria, unspecified type Referral has been sent to [facility].  Given patient contact information for [facility] to contact them and schedule appointment. 3. Snores Follow-up with sleep study as planned.   Tenna Delaine, PA-C    [facility] 12/20/2017 3:36 PM

## 2017-12-20 NOTE — Patient Instructions (Addendum)
For high blood pressure, you are currently taking losartan-HCTZ 50-12.5 mg daily.  This has 2 blood pressure medications in it.  We are going to increase the losartan aspect of the medication.  I have sent in a new prescription for losartan-HCTZ 100-12.5 mg daily.  Continue checking your blood pressure daily.  If consistently greater than 140/90 after 2 weeks.  Please take 2 tablets of the original losartan-HCTZ 50- 12.5 mg to equal total of losartan-HCTZ 100-25 mg daily.  If this does not keep your blood pressure below 140/90, please return to office.  If it does, please let me know so I can resend a prescription for this medication.  Follow-up in 3 months for reevaluation.  In the meantime, please contact [facility] to schedule an appointment to discuss the blood in your urine.   [facility] [place]  Address: [address] [place], [place], [place] [address]  Phone: [phone]   If you have lab work done today you will be contacted with your lab results within the next 2 weeks.  If you have not heard from [place] then please contact us. The fastest way to get your results is to register for My Chart.  Hypertension Hypertension is another name for high blood pressure. High blood pressure forces your heart to work harder to pump blood. This can cause problems over time. There are two numbers in a blood pressure reading. There is a top number (systolic) over a bottom number (diastolic). It is best to have a blood pressure below 120/80. Healthy choices can help lower your blood pressure. You may need medicine to help lower your blood pressure if:  Your blood pressure cannot be lowered with healthy choices.  Your blood pressure is higher than 130/80.  Follow these instructions at home: Eating and drinking  If directed, follow the DASH eating plan. This diet includes: ? Filling half of your plate at each meal with fruits and  vegetables. ? Filling one quarter of your plate at each meal with whole grains. Whole grains include whole wheat pasta, brown rice, and whole grain bread. ? Eating or drinking low-fat dairy products, such as skim milk or low-fat yogurt. ? Filling one quarter of your plate at each meal with low-fat (lean) proteins. Low-fat proteins include fish, skinless chicken, eggs, beans, and tofu. ? Avoiding fatty meat, cured and processed meat, or chicken with skin. ? Avoiding premade or processed food.  Eat less than 1,500 mg of salt (sodium) a day.  Limit alcohol use to no more than 1 drink a day for nonpregnant women and 2 drinks a day for men. One drink equals 12 oz of beer, 5 oz of wine, or 1 oz of hard liquor. Lifestyle  Work with your doctor to stay at a healthy weight or to lose weight. Ask your doctor what the best weight is for you.  Get at least 30 minutes of exercise that causes your heart to beat faster (aerobic exercise) most days of the week. This may include walking, swimming, or biking.  Get at least 30 minutes of exercise that strengthens your muscles (resistance exercise) at least 3 days a week. This may include lifting weights or pilates.  Do not use any products that contain nicotine or tobacco. This includes cigarettes and e-cigarettes. If you need help quitting, ask your doctor.  Check your blood pressure at home as told by your doctor.  Keep all follow-up visits as told by  your doctor. This is important. Medicines  Take over-the-counter and prescription medicines only as told by your doctor. Follow directions carefully.  Do not skip doses of blood pressure medicine. The medicine does not work as well if you skip doses. Skipping doses also puts you at risk for problems.  Ask your doctor about side effects or reactions to medicines that you should watch for. Contact a doctor if:  You think you are having a reaction to the medicine you are taking.  You have headaches that  keep coming back (recurring).  You feel dizzy.  You have swelling in your ankles.  You have trouble with your vision. Get help right away if:  You get a very bad headache.  You start to feel confused.  You feel weak or numb.  You feel faint.  You get very bad pain in your: ? Chest. ? Belly (abdomen).  You throw up (vomit) more than once.  You have trouble breathing. Summary  Hypertension is another name for high blood pressure.  Making healthy choices can help lower blood pressure. If your blood pressure cannot be controlled with healthy choices, you may need to take medicine. This information is not intended to replace advice given to you by your health care provider. Make sure you discuss any questions you have with your health care provider. Document Released: 08/19/2007 Document Revised: 01/29/2016 Document Reviewed: 01/29/2016 Elsevier Interactive Patient Education  2018 [employer].   IF you received an x-ray today, you will receive an invoice from [facility]. Please contact [facility] at [phone] with questions or concerns regarding your invoice.   IF you received labwork today, you will receive an invoice from [facility]. Please contact [facility] at [phone] with questions or concerns regarding your invoice.   Our billing staff will not be able to assist you with questions regarding bills from these companies.  You will be contacted with the lab results as soon as they are available. The fastest way to get your results is to activate your My Chart account. Instructions are located on the last page of this paperwork. If you have not heard from [place] regarding the results in 2 weeks, please contact this office.

## 2017-12-21 LAB — CMP14+EGFR
ALT: 18 IU/L (ref 0–44)
AST: 17 IU/L (ref 0–40)
Albumin/Globulin Ratio: 1.8 (ref 1.2–2.2)
Albumin: 4.3 g/dL (ref 3.5–5.5)
Alkaline Phosphatase: 61 IU/L (ref 39–117)
BUN / CREAT RATIO: 24 — AB (ref 9–20)
BUN: 22 mg/dL — AB (ref 6–20)
Bilirubin Total: 0.3 mg/dL (ref 0.0–1.2)
CO2: 25 mmol/L (ref 20–29)
CREATININE: 0.93 mg/dL (ref 0.76–1.27)
Calcium: 9.7 mg/dL (ref 8.7–10.2)
Chloride: 102 mmol/L (ref 96–106)
GFR calc non Af Amer: 107 mL/min/{1.73_m2} (ref >59)
GFR, EST AFRICAN AMERICAN: 124 mL/min/{1.73_m2} (ref >59)
Globulin, Total: 2.4 g/dL (ref 1.5–4.5)
Glucose: 85 mg/dL (ref 65–99)
Potassium: 3.8 mmol/L (ref 3.5–5.2)
Sodium: 142 mmol/L (ref 134–144)
TOTAL PROTEIN: 6.7 g/dL (ref 6.0–8.5)

## 2018-01-06 ENCOUNTER — Other Ambulatory Visit: Payer: Self-pay | Admitting: Physician Assistant

## 2018-01-06 ENCOUNTER — Encounter: Payer: Self-pay | Admitting: Physician Assistant

## 2018-01-06 MED ORDER — LOSARTAN POTASSIUM-HCTZ 100-25 MG PO TABS: 1.0000 | ORAL_TABLET | Freq: Every day | ORAL | 1 refills | Status: DC

## 2018-01-06 NOTE — Progress Notes (Signed)
Meds ordered this encounter  Medications  . losartan-hydrochlorothiazide (HYZAAR) 100-25 MG tablet    Sig: Take 1 tablet by mouth daily.    Dispense:  90 tablet    Refill:  1    Order Specific Question:   Supervising Provider    Answer:   Georgina Quint [phone]

## 2018-01-10 ENCOUNTER — Telehealth: Payer: Self-pay

## 2018-01-10 NOTE — Telephone Encounter (Signed)
I have tried on numerous occasions to reach the pt.  Information sent via My Chart that appt has been cancelled.

## 2018-01-10 NOTE — Telephone Encounter (Signed)
-----   Message from Lemar Lofty., MD sent at 01/10/2018  3:52 PM EDT ----- Regarding: Follow up Dear Alexia Freestone, This patient was supposed to have a MRI done before his clinic visit. There were issues with getting the MRI so we agreed to CT. He hasn't gotten this done yet. Let's plan on reaching out to him and rescheduling until after the CT is completed. Please do not open the appointment time but rather freeze it after he is rescheduled. Thanks. Liz Beach

## 2018-01-11 ENCOUNTER — Ambulatory Visit: Payer: 59 | Admitting: Gastroenterology

## 2018-02-14 ENCOUNTER — Telehealth: Payer: Self-pay

## 2018-02-14 ENCOUNTER — Ambulatory Visit (INDEPENDENT_AMBULATORY_CARE_PROVIDER_SITE_OTHER): Payer: 59 | Admitting: Neurology

## 2018-02-14 ENCOUNTER — Encounter: Payer: Self-pay | Admitting: Neurology

## 2018-02-14 VITALS — BP 185/136 | HR 91 | Ht 70.0 in | Wt 317.0 lb

## 2018-02-14 DIAGNOSIS — R0683 Snoring: Secondary | ICD-10-CM

## 2018-02-14 DIAGNOSIS — R51 Headache: Secondary | ICD-10-CM

## 2018-02-14 DIAGNOSIS — R0681 Apnea, not elsewhere classified: Secondary | ICD-10-CM

## 2018-02-14 DIAGNOSIS — Z9189 Other specified personal risk factors, not elsewhere classified: Secondary | ICD-10-CM | POA: Diagnosis not present

## 2018-02-14 DIAGNOSIS — G2581 Restless legs syndrome: Secondary | ICD-10-CM

## 2018-02-14 DIAGNOSIS — R400 Somnolence: Secondary | ICD-10-CM

## 2018-02-14 DIAGNOSIS — R519 Headache, unspecified: Secondary | ICD-10-CM

## 2018-02-14 DIAGNOSIS — G4761 Periodic limb movement disorder: Secondary | ICD-10-CM

## 2018-02-14 DIAGNOSIS — R351 Nocturia: Secondary | ICD-10-CM

## 2018-02-14 NOTE — Progress Notes (Signed)
Subjective:    Patient ID: Anthony Edwards is a 32 y.o. male.  HPI     Star Age, MD, PhD [facility] [address], [address] [place], [place] [address]  Dear Marye Round,   I saw your patient, Anthony Edwards, upon your kind request, in my sleep clinic today for initial consultation for his sleep disorder, in particular, concern for underlying obstructive sleep apnea. The patient is unaccompanied today. As you know, Anthony Edwards is a 32 year old right-handed gentleman with an underlying medical history of hypertension, hematuria and morbid obesity with a BMI of over 45, who reports snoring and excessive daytime somnolence. I reviewed your office note from 12/20/2017. His Epworth sleepiness score is 18 out of 24 today, fatigue score is 50 out of 63. He lives with his girlfriend, does not smoke, drinks alcohol about twice a week and caffeine in the form of coffee, 2 cups per day on average or 1 energy drink in the morning, 1 serving of tea per day. He drinks soda occasionally but typically without caffeine. He has nocturia about once per average night but can have more frequent nocturia up to 3 times per night. Of note, he has been without his blood pressure medicine for about 5 days. There was a shortage of his medicine at the pharmacy. He will check today. His bedtime is around 9 or 9:30 PM, rise time between 4 and 5 AM. 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 He lives with his girlfriend, no kids, 3 cats in the household but not in their bedroom and he does not typically watch TV in his bedroom. He has had occasional morning headaches, maybe once a month or so. He is not aware of any family history of OSA. He does have multiple nighttime awakenings, he feels like he wakes up every hour. He has had pauses in his breathing per girlfriend's report. He also reports restless leg symptoms and need to move his legs while in  bed, sometimes he moves his feet while asleep.  His Past Medical History Is Significant For: Past Medical History:  Diagnosis Date  . Hypertension   . Renal disorder     His Past Surgical History Is Significant For: Past Surgical History:  Procedure Laterality Date  . BIOPSY  10/28/2017   Procedure: BIOPSY;  Surgeon: Irving Copas., MD;  Location: [facility];  Service: Gastroenterology;;  . ESOPHAGOGASTRODUODENOSCOPY (EGD) WITH PROPOFOL N/A 10/28/2017   Procedure: ESOPHAGOGASTRODUODENOSCOPY (EGD) WITH PROPOFOL;  Surgeon: Irving Copas., MD;  Location: [facility];  Service: Gastroenterology;  Laterality: N/A;  . EUS N/A 10/28/2017   Procedure: UPPER ENDOSCOPIC ULTRASOUND (EUS) RADIAL;  Surgeon: Rush Landmark Telford Nab., MD;  Location: [facility];  Service: Gastroenterology;  Laterality: N/A;  . FINE NEEDLE ASPIRATION  10/28/2017   Procedure: FINE NEEDLE ASPIRATION;  Surgeon: Rush Landmark, Telford Nab., MD;  Location: [facility];  Service: Gastroenterology;;    His Family History Is Significant For: Family History  Problem Relation Age of Onset  . Diabetes Maternal Grandmother   . Pancreatic cancer Neg Hx   . Pancreatitis Neg Hx   . Pancreatic disease Neg Hx   . Liver disease Neg Hx   . Gallbladder disease Neg Hx   . Colon cancer Neg Hx     His Social History Is Significant For: Social History   Socioeconomic History  . Marital status: Significant Other    Spouse  name: Not on file  . Number of children: 0  . Years of education: Not on file  . Highest education level: Not on file  Occupational History  . Not on file  Social Needs  . Financial resource strain: Not on file  . Food insecurity:    Worry: Not on file    Inability: Not on file  . Transportation needs:    Medical: Not on file    Non-medical: Not on file  Tobacco Use  . Smoking status: Never Smoker  . Smokeless tobacco: Never Used  Substance and Sexual Activity  . Alcohol use: Yes     Alcohol/week: 3.0 standard drinks    Types: 3 Cans of beer per week  . Drug use: No  . Sexual activity: Yes  Lifestyle  . Physical activity:    Days per week: Not on file    Minutes per session: Not on file  . Stress: Not on file  Relationships  . Social connections:    Talks on phone: Not on file    Gets together: Not on file    Attends religious service: Not on file    Active member of club or organization: Not on file    Attends meetings of clubs or organizations: Not on file    Relationship status: Not on file  Other Topics Concern  . Not on file  Social History Narrative  . Not on file    His Allergies Are:  No Known Allergies:   His Current Medications Are:  Outpatient Encounter Medications as of 02/14/2018  Medication Sig  . Blood Pressure Monitoring (BLOOD PRESSURE MONITOR DELUXE) KIT 1 Units by Does not apply route daily.  Marland Kitchen ibuprofen (ADVIL,MOTRIN) 200 MG tablet Take 200-600 mg by mouth daily as needed for moderate pain.  Marland Kitchen losartan-hydrochlorothiazide (HYZAAR) 100-25 MG tablet Take 1 tablet by mouth daily.  Marland Kitchen omeprazole (PRILOSEC) 20 MG capsule Take 20 mg by mouth daily as needed (stomach).   No facility-administered encounter medications on file as of 02/14/2018.   :  Review of Systems:  Out of a complete 14 point review of systems, all are reviewed and negative with the exception of these symptoms as listed below:  Review of Systems  Neurological:       Pt presents today to discuss his sleep. Pt has never had a sleep study but does endorse snoring.  Epworth Sleepiness Scale 0= would never doze 1= slight chance of dozing 2= moderate chance of dozing 3= high chance of dozing  Sitting and reading: 3 Watching TV: 3 Sitting inactive in a public place (ex. Theater or meeting): 2 As a passenger in a car for an hour without a break: 3 Lying down to rest in the afternoon: 3 Sitting and talking to someone: 0 Sitting quietly after lunch (no alcohol): 2 In a  car, while stopped in traffic: 2 Total: 18     Objective:  Neurological Exam  Physical Exam Physical Examination:   Vitals:   02/14/18 1620  BP: (!) 185/136  Pulse: 91   General Examination: The patient is a very pleasant 32 y.o. male in no acute distress. He appears well-developed and well-nourished and well groomed.   HEENT: Normocephalic, atraumatic, pupils are equal, round and reactive to light and accommodation. Corrective eye glasses in places. Extraocular tracking is good without limitation to gaze excursion or nystagmus noted. Normal smooth pursuit is noted. Hearing is grossly intact. Tympanic membranes are clear bilaterally. Face is symmetric with normal facial  animation and normal facial sensation. Speech is clear with no dysarthria noted. There is no hypophonia. There is no lip, neck/head, jaw or voice tremor. Neck is supple with full range of passive and active motion. There are no carotid bruits on auscultation. Oropharynx exam reveals: mild mouth dryness, adequate dental hygiene and marked airway crowding, due to tonsils 2-3+, large uvula, smaller airway entry. Mallampati is class III. Neck circumference is 21-1/4 inches. He has a minimal overbite. Tongue protrudes centrally and palate elevates symmetrically.  Chest: Clear to auscultation without wheezing, rhonchi or crackles noted.  Heart: S1+S2+0, regular and normal without murmurs, rubs or gallops noted.   Abdomen: Soft, non-tender and non-distended with normal bowel sounds appreciated on auscultation.  Extremities: There is no pitting edema in the distal lower extremities bilaterally.  Skin: Warm and dry without trophic changes noted.  Musculoskeletal: exam reveals no obvious joint deformities, tenderness or joint swelling or erythema.   Neurologically:  Mental status: The patient is awake, alert and oriented in all 4 spheres. His immediate and remote memory, attention, language skills and fund of knowledge are  appropriate. There is no evidence of aphasia, agnosia, apraxia or anomia. Speech is clear with normal prosody and enunciation. Thought process is linear. Mood is normal and affect is normal.  Cranial nerves II - XII are as described above under HEENT exam. In addition: shoulder shrug is normal with equal shoulder height noted. Motor exam: Normal bulk, strength and tone is noted. There is no drift, tremor or rebound. Romberg is negative. Fine motor skills and coordination: grossly intact.  Cerebellar testing: No dysmetria or intention tremor.  Sensory exam: intact to light touch.  Gait, station and balance: He stands easily. No veering to one side is noted. No leaning to one side is noted. Posture is age-appropriate and stance is narrow based. Gait shows normal stride length and normal pace. No problems turning are noted. Tandem walk is unremarkable.   Assessment and Plan:    In summary, Anthony Edwards is a very pleasant 32 y.o.-year old male with an underlying medical history of hypertension, hematuria and morbid obesity with a BMI of over 57, whose history and physical exam are concerning for obstructive sleep apnea (OSA). I had a long chat with the patient about my findings and the diagnosis of OSA, its prognosis and treatment options. We talked about medical treatments, surgical interventions and non-pharmacological approaches. I explained in particular the risks and ramifications of untreated moderate to severe OSA, especially with respect to developing cardiovascular disease down the Road, including congestive heart failure, difficult to treat hypertension, cardiac arrhythmias, or stroke. Even type 2 diabetes has, in part, been linked to untreated OSA. Symptoms of untreated OSA include daytime sleepiness, memory problems, mood irritability and mood disorder such as depression and anxiety, lack of energy, as well as recurrent headaches, especially morning headaches. We talked about trying to maintain  a healthy lifestyle in general, as well as the importance of weight control. I encouraged the patient to eat healthy, exercise daily and keep well hydrated, to keep a scheduled bedtime and wake time routine, to not skip any meals and eat healthy snacks in between meals. I advised the patient not to drive when feeling sleepy. I recommended the following at this time: sleep study with potential positive airway pressure titration. (We will score hypopneas at 4%).   I explained the sleep test procedure to the patient and also outlined possible surgical and non-surgical treatment options of OSA, including the  use of a custom-made dental device (which would require a referral to a specialist dentist or oral surgeon), upper airway surgical options, such as pillar implants, radiofrequency surgery, tongue base surgery, and UPPP (which would involve a referral to an ENT surgeon). Rarely, jaw surgery such as mandibular advancement may be considered.  I also explained the CPAP treatment option to the patient, who indicated that he would be willing to try CPAP if the need arises. I explained the importance of being compliant with PAP treatment, not only for insurance purposes but primarily to improve His symptoms, and for the patient's long term health benefit, including to reduce His cardiovascular risks. I answered all his questions today and the patient was in agreement. I plan to see him back after the sleep study is completed and encouraged him to call with any interim questions, concerns, problems or updates.   Thank you very much for allowing me to participate in the care of this nice patient. If I can be of any further assistance to you please do not hesitate to call me at [phone].  Sincerely,   Star Age, MD, PhD

## 2018-02-14 NOTE — Telephone Encounter (Signed)
Certified letter mailed to the pt.

## 2018-02-14 NOTE — Patient Instructions (Signed)

## 2018-02-14 NOTE — Telephone Encounter (Signed)
-----   Message from Lemar LoftyGabriel Mansouraty Jr., MD sent at 02/11/2018  1:10 PM EST ----- Regarding: Patient for follow up Joncarlos Atkison, I have seen that we have not been able to get in touch with him about the need for repeat imaging and then follow up in clinic. Please send a Certified letter stating that we have tried and not had success. He can call us back if he wants to follow up. I have placed his PCP on this notation as well. Thanks.  Liz BeachGabe

## 2018-02-18 DIAGNOSIS — R079 Chest pain, unspecified: Secondary | ICD-10-CM | POA: Diagnosis not present

## 2018-02-18 DIAGNOSIS — R0789 Other chest pain: Secondary | ICD-10-CM | POA: Diagnosis not present

## 2018-02-18 DIAGNOSIS — R42 Dizziness and giddiness: Secondary | ICD-10-CM | POA: Diagnosis not present

## 2018-02-18 DIAGNOSIS — R072 Precordial pain: Secondary | ICD-10-CM | POA: Diagnosis not present

## 2018-02-18 DIAGNOSIS — R9431 Abnormal electrocardiogram [ECG] [EKG]: Secondary | ICD-10-CM | POA: Diagnosis not present

## 2018-03-21 ENCOUNTER — Ambulatory Visit (INDEPENDENT_AMBULATORY_CARE_PROVIDER_SITE_OTHER): Payer: 59 | Admitting: Neurology

## 2018-03-21 DIAGNOSIS — G4761 Periodic limb movement disorder: Secondary | ICD-10-CM

## 2018-03-21 DIAGNOSIS — R0683 Snoring: Secondary | ICD-10-CM

## 2018-03-21 DIAGNOSIS — G4733 Obstructive sleep apnea (adult) (pediatric): Secondary | ICD-10-CM

## 2018-03-21 DIAGNOSIS — Z9189 Other specified personal risk factors, not elsewhere classified: Secondary | ICD-10-CM

## 2018-03-21 DIAGNOSIS — R351 Nocturia: Secondary | ICD-10-CM

## 2018-03-21 DIAGNOSIS — G4734 Idiopathic sleep related nonobstructive alveolar hypoventilation: Secondary | ICD-10-CM

## 2018-03-21 DIAGNOSIS — R519 Headache, unspecified: Secondary | ICD-10-CM

## 2018-03-21 DIAGNOSIS — R51 Headache: Secondary | ICD-10-CM

## 2018-03-21 DIAGNOSIS — G2581 Restless legs syndrome: Secondary | ICD-10-CM

## 2018-03-21 DIAGNOSIS — R400 Somnolence: Secondary | ICD-10-CM

## 2018-03-21 DIAGNOSIS — R0681 Apnea, not elsewhere classified: Secondary | ICD-10-CM

## 2018-03-24 ENCOUNTER — Telehealth: Payer: Self-pay

## 2018-03-24 NOTE — Procedures (Signed)
[facility] @[facility] [address] [place], [place] [address] NAME:  Anthony Edwards                                                                            DOB: [birth year] MEDICAL RECORD no:   [id number]                                                      DOS:  03/21/2018 REFERRING PHYSICIAN: Benjiman Core, PA-C STUDY PERFORMED: Home Sleep Test on Watch Pat HISTORY: 33 year old man with a history of hypertension, hematuria and morbid obesity, who reports snoring and excessive daytime somnolence. His Epworth sleepiness score is 18 out of 24, BMI of 45.4.  STUDY RESULTS:   Total Recording Time: 8 hrs, 37 mins; Total Sleep Time: 7 hrs, 13 mins Total Apnea/Hypopnea Index (AHI):  75.7/h; RDI: 77.3 /h; REM sleep not detected Average Oxygen Saturation:   95%; Lowest Oxygen Desaturation: 73%  Total Time Oxygen Saturation Below or at 88%:  11.3 minutes (2.6% of TST)  Average Heart Rate:  62 bpm  IMPRESSION: OSA RECOMMENDATION: This home sleep test demonstrates severe obstructive sleep apnea with a total AHI of 75.7/hour and O2 nadir of 73%. Given the patient's medical history and sleep related complaints, treatment with positive airway pressure (in the form of CPAP) is recommended. This will require a full night CPAP titration study for proper treatment settings, O2 monitoring and mask fitting. Based on the severity of the sleep disordered breathing an attended titration study is indicated. However, patient's insurance has denied an attended sleep study; therefore, the patient will be advised to proceed with an autoPAP titration/trial at home for now. Please note that untreated obstructive sleep apnea may carry additional perioperative morbidity. Patients with significant obstructive sleep apnea should receive perioperative PAP therapy and the surgeons and particularly the anesthesiologist should be informed of the diagnosis and the severity of the sleep disordered breathing. The  patient should be cautioned not to drive, work at heights, or operate dangerous or heavy equipment when tired or sleepy. Review and reiteration of good sleep hygiene measures should be pursued with any patient. Other causes of the patient's symptoms, including circadian rhythm disturbances, an underlying mood disorder, medication effect and/or an underlying medical problem cannot be ruled out based on this test. Clinical correlation is recommended. The patient and his referring provider will be notified of the test results. The patient will be seen in follow up in sleep clinic at [facility]. I certify that I have reviewed the raw data recording prior to the issuance of this report in accordance with the standards of the American Academy of Sleep Medicine (AASM). Huston Foley, MD, PhD [facility]) Diplomat, ABPN (Neurology and Sleep)

## 2018-03-24 NOTE — Telephone Encounter (Signed)
-----   Message from Huston Foley, MD sent at 03/24/2018  7:42 AM EST ----- Patient referred by Benjiman Core, PA, seen by me on 02/14/18, HST on 03/21/18.    Please call and notify the patient that the recent home sleep test showed obstructive sleep apnea in the severe range. While I recommend treatment for this in the form CPAP, his insurance will not approve a sleep study for this. They will likely only approve a trial of autoPAP, which means, that we don't have to bring him in for a sleep study with CPAP, but will let him try an autoPAP machine at home, through a DME company (of his choice, or as per insurance requirement). The DME representative will educate him on how to use the machine, how to put the mask on, etc. I have placed an order in the chart. Please send referral, talk to patient, send report to referring MD.  I will also order an ONO about 1 week post autoPAP set up.  We will need a FU in sleep clinic for 10 weeks post-PAP set up, please arrange that with me or one of our NPs. Thanks,   Huston Foley, MD, PhD [facility])

## 2018-03-24 NOTE — Addendum Note (Signed)
Addended by: Huston Foley on: 03/24/2018 07:42 AM   Modules accepted: Orders

## 2018-03-24 NOTE — Telephone Encounter (Signed)
I called pt to discuss his sleep study results, no answer, and no VM picks up. I will send pt a mychart message asking him to call me back.

## 2018-03-24 NOTE — Telephone Encounter (Signed)
I called Anthony Edwards. I advised Anthony Edwards that Dr. Frances Furbish reviewed their sleep study results and found that Anthony Edwards has severe osa. Dr. Frances Furbish recommends that Anthony Edwards start an auto pap at home since Anthony Edwards's insurance will likely deny an in lab titration study. I reviewed PAP compliance expectations with the Anthony Edwards. Anthony Edwards is agreeable to starting an auto-PAP. I advised Anthony Edwards that an order will be sent to a DME, [facility], and [facility] will call the Anthony Edwards within about one week after they file with the Anthony Edwards's insurance. [facility] will show the Anthony Edwards how to use the machine, fit for masks, and troubleshoot the auto-PAP if needed. A follow up appt was made for insurance purposes with Dr. Frances Furbish on 06/13/18 at 8:30am. Anthony Edwards verbalized understanding to arrive 15 minutes early and bring their auto-PAP. A letter with all of this information in it will be sent to the Anthony Edwards's mychart account as a reminder. I verified with the Anthony Edwards that the address we have on file is correct. Anthony Edwards verbalized understanding of results. Anthony Edwards had no questions at this time but was encouraged to call back if questions arise. I have sent the order to [employer] and have received confirmation that they have received the order.

## 2018-03-24 NOTE — Progress Notes (Signed)
Patient referred by Benjiman Core, PA, seen by me on 02/14/18, HST on 03/21/18.    Please call and notify the patient that the recent home sleep test showed obstructive sleep apnea in the severe range. While I recommend treatment for this in the form CPAP, his insurance will not approve a sleep study for this. They will likely only approve a trial of autoPAP, which means, that we don't have to bring him in for a sleep study with CPAP, but will let him try an autoPAP machine at home, through a DME company (of his choice, or as per insurance requirement). The DME representative will educate him on how to use the machine, how to put the mask on, etc. I have placed an order in the chart. Please send referral, talk to patient, send report to referring MD.  I will also order an ONO about 1 week post autoPAP set up.  We will need a FU in sleep clinic for 10 weeks post-PAP set up, please arrange that with me or one of our NPs. Thanks,   Huston Foley, MD, PhD [facility])

## 2018-04-07 ENCOUNTER — Encounter: Payer: Self-pay | Admitting: Family Medicine

## 2018-04-07 ENCOUNTER — Other Ambulatory Visit: Payer: Self-pay

## 2018-04-07 ENCOUNTER — Ambulatory Visit (INDEPENDENT_AMBULATORY_CARE_PROVIDER_SITE_OTHER): Payer: 59 | Admitting: Family Medicine

## 2018-04-07 VITALS — BP 142/99 | HR 76 | Temp 98.6°F | Ht 69.5 in | Wt 321.8 lb

## 2018-04-07 DIAGNOSIS — I10 Essential (primary) hypertension: Secondary | ICD-10-CM

## 2018-04-07 DIAGNOSIS — G4733 Obstructive sleep apnea (adult) (pediatric): Secondary | ICD-10-CM | POA: Diagnosis not present

## 2018-04-07 DIAGNOSIS — F411 Generalized anxiety disorder: Secondary | ICD-10-CM | POA: Diagnosis not present

## 2018-04-07 DIAGNOSIS — K8689 Other specified diseases of pancreas: Secondary | ICD-10-CM

## 2018-04-07 MED ORDER — SERTRALINE HCL 50 MG PO TABS: 50.0000 mg | ORAL_TABLET | Freq: Every day | ORAL | 3 refills | Status: DC

## 2018-04-07 MED ORDER — AMLODIPINE BESYLATE 5 MG PO TABS: 5.0000 mg | ORAL_TABLET | Freq: Every day | ORAL | 3 refills | Status: DC

## 2018-04-07 NOTE — Patient Instructions (Addendum)
Please start sertaline at 1/2 tab daily for one week, then increase to 1 tab daily  Adding amlodipine 5mg , take at bedtime Continue with losartan-hctz at in the morning  If you have lab work done today you will be contacted with your lab results within the next 2 weeks.  If you have not heard from [place] then please contact us. The fastest way to get your results is to register for My Chart.   IF you received an x-ray today, you will receive an invoice from [facility]. Please contact [facility] at [phone] with questions or concerns regarding your invoice.   IF you received labwork today, you will receive an invoice from [facility]. Please contact [facility] at [phone] with questions or concerns regarding your invoice.   Our billing staff will not be able to assist you with questions regarding bills from these companies.  You will be contacted with the lab results as soon as they are available. The fastest way to get your results is to activate your My Chart account. Instructions are located on the last page of this paperwork. If you have not heard from [place] regarding the results in 2 weeks, please contact this office.     Managing Your Hypertension Hypertension is commonly called high blood pressure. This is when the force of your blood pressing against the walls of your arteries is too strong. Arteries are blood vessels that carry blood from your heart throughout your body. Hypertension forces the heart to work harder to pump blood, and may cause the arteries to become narrow or stiff. Having untreated or uncontrolled hypertension can cause heart attack, stroke, kidney disease, and other problems. What are blood pressure readings? A blood pressure reading consists of a higher number over a lower number. Ideally, your blood pressure should be below 120/80. The first ("top") number is called the systolic pressure. It is a measure of the pressure in your arteries as your  heart beats. The second ("bottom") number is called the diastolic pressure. It is a measure of the pressure in your arteries as the heart relaxes. What does my blood pressure reading mean? Blood pressure is classified into four stages. Based on your blood pressure reading, your health care provider may use the following stages to determine what type of treatment you need, if any. Systolic pressure and diastolic pressure are measured in a unit called mm Hg. Normal Systolic pressure: below 120. Diastolic pressure: below 80. Elevated Systolic pressure: 120-129. Diastolic pressure: below 80. Hypertension stage 1 Systolic pressure: 130-139. Diastolic pressure: 80-89. Hypertension stage 2 Systolic pressure: 140 or above. Diastolic pressure: 90 or above. What health risks are associated with hypertension? Managing your hypertension is an important responsibility. Uncontrolled hypertension can lead to: A heart attack. A stroke. A weakened blood vessel (aneurysm). Heart failure. Kidney damage. Eye damage. Metabolic syndrome. Memory and concentration problems. What changes can I make to manage my hypertension? Hypertension can be managed by making lifestyle changes and possibly by taking medicines. Your health care provider will help you make a plan to bring your blood pressure within a normal range. Eating and drinking  Eat a diet that is high in fiber and potassium, and low in salt (sodium), added sugar, and fat. An example eating plan is called the DASH (Dietary Approaches to Stop Hypertension) diet. To eat this way: Eat plenty of fresh fruits and vegetables. Try to fill half of your plate at each meal with fruits and vegetables. Eat whole grains, such  as whole wheat pasta, brown rice, or whole grain bread. Fill about one quarter of your plate with whole grains. Eat low-fat diary products. Avoid fatty cuts of meat, processed or cured meats, and poultry with skin. Fill about one quarter of  your plate with lean proteins such as fish, chicken without skin, beans, eggs, and tofu. Avoid premade and processed foods. These tend to be higher in sodium, added sugar, and fat. Reduce your daily sodium intake. Most people with hypertension should eat less than 1,500 mg of sodium a day. Limit alcohol intake to no more than 1 drink a day for nonpregnant women and 2 drinks a day for men. One drink equals 12 oz of beer, 5 oz of wine, or 1 oz of hard liquor. Lifestyle Work with your health care provider to maintain a healthy body weight, or to lose weight. Ask what an ideal weight is for you. Get at least 30 minutes of exercise that causes your heart to beat faster (aerobic exercise) most days of the week. Activities may include walking, swimming, or biking. Include exercise to strengthen your muscles (resistance exercise), such as weight lifting, as part of your weekly exercise routine. Try to do these types of exercises for 30 minutes at least 3 days a week. Do not use any products that contain nicotine or tobacco, such as cigarettes and e-cigarettes. If you need help quitting, ask your health care provider. Control any long-term (chronic) conditions you have, such as high cholesterol or diabetes. Monitoring Monitor your blood pressure at home as told by your health care provider. Your personal target blood pressure may vary depending on your medical conditions, your age, and other factors. Have your blood pressure checked regularly, as often as told by your health care provider. Working with your health care provider Review all the medicines you take with your health care provider because there may be side effects or interactions. Talk with your health care provider about your diet, exercise habits, and other lifestyle factors that may be contributing to hypertension. Visit your health care provider regularly. Your health care provider can help you create and adjust your plan for managing  hypertension. Will I need medicine to control my blood pressure? Your health care provider may prescribe medicine if lifestyle changes are not enough to get your blood pressure under control, and if: Your systolic blood pressure is 130 or higher. Your diastolic blood pressure is 80 or higher. Take medicines only as told by your health care provider. Follow the directions carefully. Blood pressure medicines must be taken as prescribed. The medicine does not work as well when you skip doses. Skipping doses also puts you at risk for problems. Contact a health care provider if: You think you are having a reaction to medicines you have taken. You have repeated (recurrent) headaches. You feel dizzy. You have swelling in your ankles. You have trouble with your vision. Get help right away if: You develop a severe headache or confusion. You have unusual weakness or numbness, or you feel faint. You have severe pain in your chest or abdomen. You vomit repeatedly. You have trouble breathing. Summary Hypertension is when the force of blood pumping through your arteries is too strong. If this condition is not controlled, it may put you at risk for serious complications. Your personal target blood pressure may vary depending on your medical conditions, your age, and other factors. For most people, a normal blood pressure is less than 120/80. Hypertension is managed by lifestyle changes,  medicines, or both. Lifestyle changes include weight loss, eating a healthy, low-sodium diet, exercising more, and limiting alcohol. This information is not intended to replace advice given to you by your health care provider. Make sure you discuss any questions you have with your health care provider. Document Released: 11/25/2011 Document Revised: 01/29/2016 Document Reviewed: 01/29/2016 Elsevier Interactive Patient Education  2019 [employer] Inc.  Living With Anxiety  After being diagnosed with an anxiety disorder, you  may be relieved to know why you have felt or behaved a certain way. It is natural to also feel overwhelmed about the treatment ahead and what it will mean for your life. With care and support, you can manage this condition and recover from it. How to cope with anxiety Dealing with stress Stress is your body's reaction to life changes and events, both good and bad. Stress can last just a few hours or it can be ongoing. Stress can play a major role in anxiety, so it is important to learn both how to cope with stress and how to think about it differently. Talk with your health care provider or a counselor to learn more about stress reduction. He or she may suggest some stress reduction techniques, such as:  Music therapy. This can include creating or listening to music that you enjoy and that inspires you.  Mindfulness-based meditation. This involves being aware of your normal breaths, rather than trying to control your breathing. It can be done while sitting or walking.  Centering prayer. This is a kind of meditation that involves focusing on a word, phrase, or sacred image that is meaningful to you and that brings you peace.  Deep breathing. To do this, expand your stomach and inhale slowly through your nose. Hold your breath for 3-5 seconds. Then exhale slowly, allowing your stomach muscles to relax.  Self-talk. This is a skill where you identify thought patterns that lead to anxiety reactions and correct those thoughts.  Muscle relaxation. This involves tensing muscles then relaxing them. Choose a stress reduction technique that fits your lifestyle and personality. Stress reduction techniques take time and practice. Set aside 5-15 minutes a day to do them. Therapists can offer training in these techniques. The training may be covered by some insurance plans. Other things you can do to manage stress include:  Keeping a stress diary. This can help you learn what triggers your stress and ways to  control your response.  Thinking about how you respond to certain situations. You may not be able to control everything, but you can control your reaction.  Making time for activities that help you relax, and not feeling guilty about spending your time in this way. Therapy combined with coping and stress-reduction skills provides the best chance for successful treatment. Medicines Medicines can help ease symptoms. Medicines for anxiety include:  Anti-anxiety drugs.  Antidepressants.  Beta-blockers. Medicines may be used as the main treatment for anxiety disorder, along with therapy, or if other treatments are not working. Medicines should be prescribed by a health care provider. Relationships Relationships can play a big part in helping you recover. Try to spend more time connecting with trusted friends and family members. Consider going to couples counseling, taking family education classes, or going to family therapy. Therapy can help you and others better understand the condition. How to recognize changes in your condition Everyone has a different response to treatment for anxiety. Recovery from anxiety happens when symptoms decrease and stop interfering with your daily activities at home  or work. This may mean that you will start to:  Have better concentration and focus.  Sleep better.  Be less irritable.  Have more energy.  Have improved memory. It is important to recognize when your condition is getting worse. Contact your health care provider if your symptoms interfere with home or work and you do not feel like your condition is improving. Where to find help and support: You can get help and support from these sources:  Self-help groups.  Online and [employer].  A trusted spiritual leader.  Couples counseling.  Family education classes.  Family therapy. Follow these instructions at home:  Eat a healthy diet that includes plenty of vegetables, fruits,  whole grains, low-fat dairy products, and lean protein. Do not eat a lot of foods that are high in solid fats, added sugars, or salt.  Exercise. Most adults should do the following: ? Exercise for at least 150 minutes each week. The exercise should increase your heart rate and make you sweat (moderate-intensity exercise). ? Strengthening exercises at least twice a week.  Cut down on caffeine, tobacco, alcohol, and other potentially harmful substances.  Get the right amount and quality of sleep. Most adults need 7-9 hours of sleep each night.  Make choices that simplify your life.  Take over-the-counter and prescription medicines only as told by your health care provider.  Avoid caffeine, alcohol, and certain over-the-counter cold medicines. These may make you feel worse. Ask your pharmacist which medicines to avoid.  Keep all follow-up visits as told by your health care provider. This is important. Questions to ask your health care provider  Would I benefit from therapy?  How often should I follow up with a health care provider?  How long do I need to take medicine?  Are there any long-term side effects of my medicine?  Are there any alternatives to taking medicine? Contact a health care provider if:  You have a hard time staying focused or finishing daily tasks.  You spend many hours a day feeling worried about everyday life.  You become exhausted by worry.  You start to have headaches, feel tense, or have nausea.  You urinate more than normal.  You have diarrhea. Get help right away if:  You have a racing heart and shortness of breath.  You have thoughts of hurting yourself or others. If you ever feel like you may hurt yourself or others, or have thoughts about taking your own life, get help right away. You can go to your nearest emergency department or call:  Your local emergency services (911 in the [place]  A suicide crisis helpline, such as the National Suicide  Prevention Lifeline at 214-153-8626. This is open 24-hours a day. Summary  Taking steps to deal with stress can help calm you.  Medicines cannot cure anxiety disorders, but they can help ease symptoms.  Family, friends, and partners can play a big part in helping you recover from an anxiety disorder. This information is not intended to replace advice given to you by your health care provider. Make sure you discuss any questions you have with your health care provider. Document Released: 02/25/2016 Document Revised: 02/25/2016 Document Reviewed: 02/25/2016 Elsevier Interactive Patient Education  2019 [employer].

## 2018-04-07 NOTE — Progress Notes (Signed)
1/23/20208:16 AM  Anthony Edwards 1985/12/14, 33 y.o. male [id number]  Chief Complaint  Patient presents with  . Hypertension    wants to talk about anxiety, says it is geting worse    HPI:   Patient is a 33 y.o. male with past medical history significant for HTN, OSA, pancreatic mass undergoing eval who presents today for to establish care  Previous PCP Timmothy Euler, PA-C  Recently had sleep study, dx with OSA, goes today to pickup cpap  Has taken BP med this morning  Would like to talk regarding anxiety  Feels like he has always struggled with anxiety but it has slowly been getting worse Has a constant fear of something going wrong, getting in trouble, very paralyzing, denies any trauma, feels that overall life is good, has poor sleep - OSA, has not been stress eating of recent, has physical job but does not exercise.   GAD 7 : Generalized Anxiety Score 04/07/2018  Nervous, Anxious, on Edge 3  Control/stop worrying 3  Worry too much - different things 3  Trouble relaxing 3  Restless 3  Easily annoyed or irritable 1  Afraid - awful might happen 2  Total GAD 7 Score 18  Anxiety Difficulty Extremely difficult    Had a biopsy of pancreatic mass in aug 2019  Was supposed to have MRI done, it was scheduled and then cancelled, has not heard back from them  Patient Care Team: Rutherford Guys, MD as PCP - General (Family Medicine) Mansouraty, Telford Nab., MD as Consulting Physician (Gastroenterology) Melvenia Beam, MD as Consulting Physician (Neurology)  Fall Risk  04/07/2018 12/20/2017 11/12/2017 06/04/2017 05/28/2017  Falls in the past year? 0 No No No No     Depression screen [facility] 2/9 12/20/2017 11/12/2017 06/04/2017  Decreased Interest 0 0 0  Down, Depressed, Hopeless 0 0 0  PHQ - 2 Score 0 0 0    No Known Allergies  Prior to Admission medications   Medication Sig Start Date End Date Taking? Authorizing Provider  Blood Pressure Monitoring (BLOOD PRESSURE MONITOR DELUXE)  KIT 1 Units by Does not apply route daily. 11/12/17  Yes Timmothy Euler, [place] D, PA-C  ibuprofen (ADVIL,MOTRIN) 200 MG tablet Take 200-600 mg by mouth daily as needed for moderate pain.   Yes [provider]  losartan-hydrochlorothiazide (HYZAAR) 100-25 MG tablet Take 1 tablet by mouth daily. 01/06/18  Yes Leonie Douglas, PA-C    Past Medical History:  Diagnosis Date  . Hypertension   . Renal disorder     Past Surgical History:  Procedure Laterality Date  . BIOPSY  10/28/2017   Procedure: BIOPSY;  Surgeon: Irving Copas., MD;  Location[facility];  Service: Gastroenterology;;  . ESOPHAGOGASTRODUODENOSCOPY (EGD) WITH PROPOFOL N/A 10/28/2017   Procedure: ESOPHAGOGASTRODUODENOSCOPY (EGD) WITH PROPOFOL;  Surgeon: Irving Copas., MD;  Location: [facility];  Service: Gastroenterology;  Laterality: N/A;  . EUS N/A 10/28/2017   Procedure: UPPER ENDOSCOPIC ULTRASOUND (EUS) RADIAL;  Surgeon: Rush Landmark Telford Nab., MD;  [facility];  Service: Gastroenterology;  Laterality: N/A;  . FINE NEEDLE ASPIRATION  10/28/2017   Procedure: FINE NEEDLE ASPIRATION;  Surgeon: Rush Landmark Telford Nab., MD;  Location: [facility];  Service: Gastroenterology;;    Social History   Tobacco Use  . Smoking status: Never Smoker  . Smokeless tobacco: Never Used  Substance Use Topics  . Alcohol use: Yes    Alcohol/week: 3.0 standard drinks    Types: 3 Cans of beer per week  Family History  Problem Relation Age of Onset  . Diabetes Maternal Grandmother   . Pancreatic cancer Neg Hx   . Pancreatitis Neg Hx   . Pancreatic disease Neg Hx   . Liver disease Neg Hx   . Gallbladder disease Neg Hx   . Colon cancer Neg Hx     Review of Systems  Constitutional: Negative for chills and fever.  Respiratory: Negative for cough and shortness of breath.   Cardiovascular: Negative for chest pain, palpitations and leg swelling.  Gastrointestinal: Negative for abdominal pain,  nausea and vomiting.     OBJECTIVE:  Blood pressure (!) 142/99, pulse 76, temperature 98.6 F (37 C), temperature source Oral, height 5' 9.5" (1.765 m), weight (!) 321 lb 12.8 oz (146 kg), SpO2 99 %. Body mass index is 46.84 kg/m.   Wt Readings from Last 3 Encounters:  04/07/18 (!) 321 lb 12.8 oz (146 kg)  02/14/18 (!) 317 lb (143.8 kg)  12/20/17 (!) 332 lb 3.2 oz (150.7 kg)   BP Readings from Last 3 Encounters:  04/07/18 (!) 142/99  02/14/18 (!) 185/136  12/20/17 (!) 144/86    Physical Exam Vitals signs and nursing note reviewed.  Constitutional:      Appearance: He is well-developed.  HENT:     Head: Normocephalic and atraumatic.  Eyes:     Conjunctiva/sclera: Conjunctivae normal.     Pupils: Pupils are equal, round, and reactive to light.  Neck:     Musculoskeletal: Neck supple.     Thyroid: No thyroid mass, thyromegaly or thyroid tenderness.  Cardiovascular:     Rate and Rhythm: Normal rate and regular rhythm.     Heart sounds: No murmur. No friction rub. No gallop.   Pulmonary:     Effort: Pulmonary effort is normal.     Breath sounds: Normal breath sounds. No wheezing or rales.  Musculoskeletal:     Right lower leg: No edema.     Left lower leg: No edema.  Skin:    General: Skin is warm and dry.  Neurological:     Mental Status: He is alert and oriented to person, place, and time.  Psychiatric:        Mood and Affect: Affect is tearful.     ASSESSMENT and PLAN  1. Essential hypertension Uncontrolled. Adding amlodipine, r/se/b reviewed. RTC precautions given - Lipid panel - TSH - CMP14+EGFR  2. Generalized anxiety disorder New diagnosis. Discussed treatment options. Starting sertraline, reviewed r/se/b. Referring for counseling, possible group. Discussed supportive measures - Ambulatory referral to Psychology  3. Obesity, Class III, BMI 40-49.9 (morbid obesity) ([place]) Continue with weight loss efforts  4. Pancreatic mass Strongly encouraged  patient to followup with GI as intended  Other orders - sertraline (ZOLOFT) 50 MG tablet; Take 1 tablet (50 mg total) by mouth daily. - amLODipine (NORVASC) 5 MG tablet; Take 1 tablet (5 mg total) by mouth daily.    Return in about 4 weeks (around 05/05/2018).    Rutherford Guys, MD Primary Care at [place], [place] [address] Ph.  [phone] Fax [phone]

## 2018-04-08 LAB — TSH: TSH: 1.51 u[IU]/mL (ref 0.450–4.500)

## 2018-04-08 LAB — LIPID PANEL
Chol/HDL Ratio: 3.5 ratio (ref 0.0–5.0)
Cholesterol, Total: 136 mg/dL (ref 100–199)
HDL: 39 mg/dL — ABNORMAL LOW (ref >39)
LDL Calculated: 80 mg/dL (ref 0–99)
Triglycerides: 83 mg/dL (ref 0–149)
VLDL Cholesterol Cal: 17 mg/dL (ref 5–40)

## 2018-04-08 LAB — CMP14+EGFR
ALT: 20 IU/L (ref 0–44)
AST: 15 IU/L (ref 0–40)
Albumin/Globulin Ratio: 1.8 (ref 1.2–2.2)
Albumin: 4.5 g/dL (ref 4.0–5.0)
Alkaline Phosphatase: 58 IU/L (ref 39–117)
BUN/Creatinine Ratio: 27 — ABNORMAL HIGH (ref 9–20)
BUN: 25 mg/dL — ABNORMAL HIGH (ref 6–20)
Bilirubin Total: 0.4 mg/dL (ref 0.0–1.2)
CO2: 22 mmol/L (ref 20–29)
Calcium: 9.7 mg/dL (ref 8.7–10.2)
Chloride: 103 mmol/L (ref 96–106)
Creatinine, Ser: 0.92 mg/dL (ref 0.76–1.27)
GFR calc Af Amer: 126 mL/min/{1.73_m2} (ref >59)
GFR calc non Af Amer: 109 mL/min/{1.73_m2} (ref >59)
Globulin, Total: 2.5 g/dL (ref 1.5–4.5)
Glucose: 103 mg/dL — ABNORMAL HIGH (ref 65–99)
Potassium: 4.5 mmol/L (ref 3.5–5.2)
Sodium: 143 mmol/L (ref 134–144)
Total Protein: 7 g/dL (ref 6.0–8.5)

## 2018-05-08 DIAGNOSIS — G4733 Obstructive sleep apnea (adult) (pediatric): Secondary | ICD-10-CM | POA: Diagnosis not present

## 2018-06-06 DIAGNOSIS — G4733 Obstructive sleep apnea (adult) (pediatric): Secondary | ICD-10-CM | POA: Diagnosis not present

## 2018-06-09 ENCOUNTER — Telehealth: Payer: Self-pay

## 2018-06-09 NOTE — Telephone Encounter (Signed)
Due to current COVID 19 pandemic, our office is severely reducing in office visits for at least the next 2 weeks, in order to minimize the risk to our patients and healthcare providers.   I called pt to discuss this. No answer, left a message asking him to call me back.

## 2018-06-09 NOTE — Telephone Encounter (Addendum)
Pt returned my call. I discussed this with him. Pt is agreeable to a virtual appt during his scheduled appt on Monday.  Pt understands that although there may be some limitations with this type of visit, we will take all precautions to reduce any security or privacy concerns.  Pt understands that this will be treated like an in office visit and we will file with pt's insurance, and there may be a patient responsible charge related to this service.  Pt's email is [email or web address]. Pt understands that the [employer] webex software must be downloaded and operational on the device pt plans to use for the visit.  Pt's med list, allergies, and PMH were updated.  Pt reports that he is 5'10 and 325lbs.

## 2018-06-13 ENCOUNTER — Other Ambulatory Visit: Payer: Self-pay

## 2018-06-13 ENCOUNTER — Ambulatory Visit: Payer: 59 | Admitting: Neurology

## 2018-06-13 NOTE — Telephone Encounter (Signed)
I called pt, explained that Dr. Frances Furbish is out of the office today. Pt is agreeable to moving his appt until 06/27/18 at 4:00pm. Pt verbalized understanding of new appt date and time.

## 2018-06-22 ENCOUNTER — Encounter: Payer: Self-pay | Admitting: Neurology

## 2018-06-23 NOTE — Telephone Encounter (Signed)
I called pt to do a ESS with him. No answer, left a message asking him to call me back.

## 2018-06-27 ENCOUNTER — Ambulatory Visit (INDEPENDENT_AMBULATORY_CARE_PROVIDER_SITE_OTHER): Payer: 59 | Admitting: Neurology

## 2018-06-27 ENCOUNTER — Encounter: Payer: Self-pay | Admitting: Neurology

## 2018-06-27 ENCOUNTER — Other Ambulatory Visit: Payer: Self-pay

## 2018-06-27 DIAGNOSIS — G4733 Obstructive sleep apnea (adult) (pediatric): Secondary | ICD-10-CM | POA: Diagnosis not present

## 2018-06-27 DIAGNOSIS — Z9989 Dependence on other enabling machines and devices: Secondary | ICD-10-CM | POA: Diagnosis not present

## 2018-06-27 NOTE — Patient Instructions (Signed)
Given verbally, during today's virtual video-based encounter, with verbal feedback received.   

## 2018-06-27 NOTE — Progress Notes (Signed)
Interim history:   Anthony Edwards is a 33 year old right-handed gentleman with an underlying medical history of hypertension, hematuria and morbid obesity with a BMI of over 45, with whom I am conducting a FU virtual visit via Webex, in lieu of a face-to-face visit, for follow-up consultation of his obstructive sleep apnea and treatment with AutoPap. The patient is unaccompanied today and joins from home. I first met him on 02/14/2018 at the request of Tenna Delaine, [place], at which time the patient reported snoring and daytime somnolence, with an Epworth sleepiness score of 18 out of 24 at the time. He was advised to proceed with a sleep study. His insurance denied a laboratory attended sleep study and he had a home sleep test on 03/21/2018 which indicated severe sleep apnea with an AHI of 75.7 per hour, average oxygen saturation of 95%, nadir of 73% with time below or at 88% saturation of 11.3 minutes. The patient was advised to proceed with AutoPap therapy at home.  Today, 06/27/2018: Please also see below for documentation of the virtual visit.  I reviewed his AutoPap compliance data from 05/24/2018 through 06/22/2018 which is a total of 30 days, during which time he used his AutoPap every night with percent used days greater than 4 hours at 97%, indicating excellent compliance with an average usage of 7 hours and 16 minutes, residual AHI at goal at 1.2 per hour, 95th percentile pressure at 12.4 cm, leak acceptable for the 95th percentile at 8.6 L/m on a pressure range of 7 cm to 15 cm.   The patient's allergies, current medications, family history, past medical history, past social history, past surgical history and problem list were reviewed and updated as appropriate.   Previously:   02/14/2018: (He) reports snoring and excessive daytime somnolence. I reviewed your office note from 12/20/2017. His Epworth sleepiness score is 18 out of 24 today, fatigue score is 50 out of 63. He lives with his  girlfriend, does not smoke, drinks alcohol about twice a week and caffeine in the form of coffee, 2 cups per day on average or 1 energy drink in the morning, 1 serving of tea per day. He drinks soda occasionally but typically without caffeine. He has nocturia about once per average night but can have more frequent nocturia up to 3 times per night. Of note, he has been without his blood pressure medicine for about 5 days. There was a shortage of his medicine at the pharmacy. He will check today. His bedtime is around 9 or 9:30 PM, rise time between 4 and 5 AM. 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 He lives with his girlfriend, no kids, 3 cats in the household but not in their bedroom and he does not typically watch TV in his bedroom. He has had occasional morning headaches, maybe once a month or so. He is not aware of any family history of OSA. He does have multiple nighttime awakenings, he feels like he wakes up every hour. He has had pauses in his breathing per girlfriend's report. He also reports restless leg symptoms and need to move his legs while in bed, sometimes he moves his feet while asleep.  His Past Medical History Is Significant For: Past Medical History:  Diagnosis Date  . Hypertension   . Renal disorder     His Past Surgical History Is Significant For: Past Surgical History:  Procedure Laterality Date  . BIOPSY  10/28/2017   Procedure: BIOPSY;  Surgeon: Irving Copas., MD;  Location: [facility];  Service: Gastroenterology;;  . ESOPHAGOGASTRODUODENOSCOPY (EGD) WITH PROPOFOL N/A 10/28/2017   Procedure: ESOPHAGOGASTRODUODENOSCOPY (EGD) WITH PROPOFOL;  Surgeon: Irving Copas., MD;  Location: [facility];  Service: Gastroenterology;  Laterality: N/A;  . EUS N/A 10/28/2017   Procedure: UPPER ENDOSCOPIC ULTRASOUND (EUS) RADIAL;  Surgeon: Rush Landmark Telford Nab., MD;  [facility];  Service:  Gastroenterology;  Laterality: N/A;  . FINE NEEDLE ASPIRATION  10/28/2017   Procedure: FINE NEEDLE ASPIRATION;  Surgeon: Rush Landmark, Telford Nab., MD;  [facility];  Service: Gastroenterology;;    His Family History Is Significant For: Family History  Problem Relation Age of Onset  . Diabetes Maternal Grandmother   . Pancreatic cancer Neg Hx   . Pancreatitis Neg Hx   . Pancreatic disease Neg Hx   . Liver disease Neg Hx   . Gallbladder disease Neg Hx   . Colon cancer Neg Hx     His Social History Is Significant For: Social History   Socioeconomic History  . Marital status: Significant Other    Spouse name: Not on file  . Number of children: 0  . Years of education: Not on file  . Highest education level: Not on file  Occupational History  . Not on file  Social Needs  . Financial resource strain: Not on file  . Food insecurity:    Worry: Not on file    Inability: Not on file  . Transportation needs:    Medical: Not on file    Non-medical: Not on file  Tobacco Use  . Smoking status: Never Smoker  . Smokeless tobacco: Never Used  Substance and Sexual Activity  . Alcohol use: Yes    Alcohol/week: 3.0 standard drinks    Types: 3 Cans of beer per week  . Drug use: No  . Sexual activity: Yes  Lifestyle  . Physical activity:    Days per week: Not on file    Minutes per session: Not on file  . Stress: Not on file  Relationships  . Social connections:    Talks on phone: Not on file    Gets together: Not on file    Attends religious service: Not on file    Active member of club or organization: Not on file    Attends meetings of clubs or organizations: Not on file    Relationship status: Not on file  Other Topics Concern  . Not on file  Social History Narrative  . Not on file    His Allergies Are:  No Known Allergies:   His Current Medications Are:  Outpatient Encounter Medications as of 06/27/2018  Medication Sig  . amLODipine (NORVASC) 5 MG tablet  Take 1 tablet (5 mg total) by mouth daily.  . Blood Pressure Monitoring (BLOOD PRESSURE MONITOR DELUXE) KIT 1 Units by Does not apply route daily.  Marland Kitchen ibuprofen (ADVIL,MOTRIN) 200 MG tablet Take 200-600 mg by mouth daily as needed for moderate pain.  Marland Kitchen losartan-hydrochlorothiazide (HYZAAR) 100-25 MG tablet Take 1 tablet by mouth daily.  . sertraline (ZOLOFT) 50 MG tablet Take 1 tablet (50 mg total) by mouth daily.   No facility-administered encounter medications on file as of 06/27/2018.   :  Review of Systems:  Out of a complete 14 point review of systems, all are reviewed and negative with the exception of these symptoms as listed below:  Virtual Visit via Video Note on @ [facility]  I  connected with@ on 06/27/18 at  4:00 PM EDT by a video enabled telemedicine application and verified that I am speaking with the correct person using two identifiers.   I discussed the limitations of evaluation and management by telemedicine and the availability of in person appointments. The patient expressed understanding and agreed to proceed.  History of Present Illness: He reports feeling much better rested, not as sleepy during the day, is motivated to continue. He uses a nasal mask. He has change the filter. Set up date was 04/07/2018. His weight is plus minus the same he states. He is still going into work. He works in [employer] manager.    Observations/Objective: The most recent vital signs on file for my review are from 04/07/2018: Blood pressure 142/99, pulse 76, temperature 98.6, weight 321.8 pounds for BMI of 46.84. His current Epworth sleepiness score is 4 out of 24 which is much improved from previously 18 out of 24. On examination, he is in no acute distress, pleasant, conversant. Face is symmetric, speech clear, without dysarthria, hypophonia or voice tremor. Extraocular movements are preserved. He is wearing corrective eyeglasses. Upper body movements are without problems, coordination in the  upper extremities grossly intact.  Assessment and Plan:  In summary, Anthony Edwards is a very pleasant 33 year old male with an underlying medical history of hypertension, hematuria and morbid obesity, who presents for a virtual, video based follow-up appointment for his severe obstructive sleep apnea, as determined by home sleep testing in early January 2020. He has established AutoPap therapy in late January 2020 and is fully compliant with treatment. He is highly commended for his treatment adherence. He indicates sleeping better, having less daytime somnolence and better sleep consolidation. His apnea score is at goal, leak on the low end, compliance excellent with an average usage of over 7 hours. He is encouraged to continue with treatment with full compliance. In addition, I would like to proceed with an overnight pulse oximetry test while he is on AutoPap therapy just to ensure that his oxygen saturations are adequate currently since his home sleep testing showed desaturation down to 73%. He is in agreement. He is encouraged to pursue weight loss. He is advised to follow-up in clinic in 6 months with a nurse practitioner. We will call him in the interim with his pulse oximetry test result. I answered all his questions today and he was in agreement.  Follow Up Instructions:  1. Continue using autoPAP regularly with full compliance, patient is commended for treatment adherence. 2. Follow-up in 6 mo, with NP next time.  3. ONO order in chart, will fax to [place]. We will call with results. 4. Call or email through My Chart for any interim questions or concerns.   I discussed the assessment and treatment plan with the patient. The patient was provided an opportunity to ask questions and all were answered. The patient agreed with the plan and demonstrated an understanding of the instructions.   The patient was advised to call back or seek an in-person evaluation if the symptoms worsen or if  the condition fails to improve as anticipated.  I provided 18 minutes of non-face-to-face time during this encounter.   Star Age, MD

## 2018-07-05 ENCOUNTER — Telehealth: Payer: Self-pay | Admitting: Family Medicine

## 2018-07-05 NOTE — Telephone Encounter (Signed)
LVM for pt to CB and convert OV to Webex  RF

## 2018-07-07 DIAGNOSIS — G4733 Obstructive sleep apnea (adult) (pediatric): Secondary | ICD-10-CM | POA: Diagnosis not present

## 2018-07-08 ENCOUNTER — Telehealth (INDEPENDENT_AMBULATORY_CARE_PROVIDER_SITE_OTHER): Payer: 59 | Admitting: Family Medicine

## 2018-07-08 ENCOUNTER — Other Ambulatory Visit: Payer: Self-pay

## 2018-07-08 DIAGNOSIS — F411 Generalized anxiety disorder: Secondary | ICD-10-CM | POA: Insufficient documentation

## 2018-07-08 DIAGNOSIS — G4733 Obstructive sleep apnea (adult) (pediatric): Secondary | ICD-10-CM | POA: Insufficient documentation

## 2018-07-08 DIAGNOSIS — I10 Essential (primary) hypertension: Secondary | ICD-10-CM

## 2018-07-08 DIAGNOSIS — Z9989 Dependence on other enabling machines and devices: Secondary | ICD-10-CM | POA: Insufficient documentation

## 2018-07-08 NOTE — Patient Instructions (Signed)
[facility] Address: [address], [place], [place] [address]  Phone: [phone]

## 2018-07-08 NOTE — Progress Notes (Signed)
BP and Depression follow up. Medication refills not needed.

## 2018-07-08 NOTE — Progress Notes (Signed)
Virtual Visit Note  I connected with patient on 07/08/18 at 344pm by telephone and verified that I am speaking with the correct person using two identifiers. Anthony Edwards is currently located at home and patient is currently with them during visit. The provider, Rutherford Guys, MD is located in their office at time of visit.  I discussed the limitations, risks, security and privacy concerns of performing an evaluation and management service by telephone and the availability of in person appointments. I also discussed with the patient that there may be a patient responsible charge related to this service. The patient expressed understanding and agreed to proceed.   CC: BP and anxiety  HPI ? Patient is a 33 y.o. male with past medical history significant for HTN, OSA on cpap, pancreatic mass, anxiety who presents today for folowup  Last OV Jan 2020 Started amlodipine 77m and sertraline 547mChecks BP at home: today, 110/70 Started cpap, doing well, getting better sleep Anxiety is improved but not controlled Has not heard from referral for counseling - requesting phone number for him to schedule Tolerating meds well Has not followed up on pancreatic mass yet  No Known Allergies  Prior to Admission medications   Medication Sig Start Date End Date Taking? Authorizing Provider  amLODipine (NORVASC) 5 MG tablet Take 1 tablet (5 mg total) by mouth daily. 04/07/18  Yes SaRutherford GuysMD  Blood Pressure Monitoring (BLOOD PRESSURE MONITOR DELUXE) KIT 1 Units by Does not apply route daily. 11/12/17  Yes WiTimmothy EulerBrTanzania, PA-C  losartan-hydrochlorothiazide (HYZAAR) 100-25 MG tablet Take 1 tablet by mouth daily. 01/06/18  Yes WiTimmothy EulerBrT[place], PA-C  sertraline (ZOLOFT) 50 MG tablet Take 1 tablet (50 mg total) by mouth daily. 04/07/18  Yes SaRutherford GuysMD  ibuprofen (ADVIL,MOTRIN) 200 MG tablet Take 200-600 mg by mouth daily as needed for moderate pain.    [provider]    Past Medical History:  Diagnosis Date  . Hypertension   . Renal disorder     Past Surgical History:  Procedure Laterality Date  . BIOPSY  10/28/2017   Procedure: BIOPSY;  Surgeon: MaIrving Copas MD;  [facility];  Service: Gastroenterology;;  . ESOPHAGOGASTRODUODENOSCOPY (EGD) WITH PROPOFOL N/A 10/28/2017   Procedure: ESOPHAGOGASTRODUODENOSCOPY (EGD) WITH PROPOFOL;  Surgeon: MaIrving Copas MD;  [facility];  Service: Gastroenterology;  Laterality: N/A;  . EUS N/A 10/28/2017   Procedure: UPPER ENDOSCOPIC ULTRASOUND (EUS) RADIAL;  Surgeon: MaRush LandmarkaTelford Nab MD;  [facility];  Service: Gastroenterology;  Laterality: N/A;  . FINE NEEDLE ASPIRATION  10/28/2017   Procedure: FINE NEEDLE ASPIRATION;  Surgeon: MaRush LandmarkaTelford Nab MD;  [facility];  Service: Gastroenterology;;    Social History   Tobacco Use  . Smoking status: Never Smoker  . Smokeless tobacco: Never Used  Substance Use Topics  . Alcohol use: Yes    Alcohol/week: 3.0 standard drinks    Types: 3 Cans of beer per week    Family History  Problem Relation Age of Onset  . Diabetes Maternal Grandmother   . Pancreatic cancer Neg Hx   . Pancreatitis Neg Hx   . Pancreatic disease Neg Hx   . Liver disease Neg Hx   . Gallbladder disease Neg Hx   . Colon cancer Neg Hx     ROS Per hpi  Objective  Vitals as reported by the patient: none   ASSESSMENT and PLAN  1. Essential hypertension Controlled. Continue current regime.  2. Generalized anxiety disorder Improved but not controlled. Provided contact info for behavioral health Does not want to increase meds at this time  FOLLOW-UP: 6 months or sooner if needed   The above assessment and management plan was discussed with the patient. The patient verbalized understanding of and has agreed to the management plan. Patient is aware to call the clinic if symptoms persist or worsen. Patient is  aware when to return to the clinic for a follow-up visit. Patient educated on when it is appropriate to go to the emergency department.    I provided 11 minutes of non-face-to-face time during this encounter.  Rutherford Guys, MD Primary Care at [place], [place] [address] Ph.  [phone] Fax [phone]

## 2018-07-27 ENCOUNTER — Other Ambulatory Visit: Payer: Self-pay | Admitting: Family Medicine

## 2018-07-27 NOTE — Telephone Encounter (Signed)
Requested Prescriptions  Pending Prescriptions Disp Refills  . sertraline (ZOLOFT) 50 MG tablet [Pharmacy Med Name: SERTRALINE 50MG  TABLETS] 90 tablet 0    Sig: TAKE 1 TABLET(50 MG) BY MOUTH DAILY     Psychiatry:  Antidepressants - SSRI Passed - 07/27/2018 11:45 AM      Passed - Valid encounter within last 6 months    Recent Outpatient Visits          3 months ago Essential hypertension   [facility] at [facility], Meda Coffee, MD   7 months ago Essential hypertension   [facility] at [place], [place] D, PA-C   8 months ago Essential hypertension   Primary Care at [place], [place] D, PA-C   1 year ago Hematuria, unspecified type   Primary Care at [place], [place] D, PA-C   1 year ago Acute right-sided low back pain without sciatica   Primary Care at [facility], Gerald Stabs, PA-C      Future Appointments            In 5 months Leretha Pol, Meda Coffee, MD Primary Care at [place], [facility]

## 2018-08-06 DIAGNOSIS — G4733 Obstructive sleep apnea (adult) (pediatric): Secondary | ICD-10-CM | POA: Diagnosis not present

## 2018-08-25 ENCOUNTER — Other Ambulatory Visit: Payer: Self-pay | Admitting: Physician Assistant

## 2018-09-24 ENCOUNTER — Telehealth: Payer: Self-pay | Admitting: Nurse Practitioner

## 2018-09-24 DIAGNOSIS — J029 Acute pharyngitis, unspecified: Secondary | ICD-10-CM

## 2018-09-24 DIAGNOSIS — R519 Headache, unspecified: Secondary | ICD-10-CM

## 2018-09-24 NOTE — Progress Notes (Signed)
E-Visit for Corona Virus Screening   Based on your current symptoms, it seems unlikely that your symptoms are related to the Coronavirus.   If your symptoms worsen or you develop a fever, we would be glad to reevaluate you at that time.  COVID-19 is a respiratory illness with symptoms that are similar to the flu. Symptoms are typically mild to moderate, but there have been cases of severe illness and death due to the virus. The following symptoms may appear 2-14 days after exposure: . Fever . Cough . Shortness of breath or difficulty breathing . Chills . Repeated shaking with chills . Muscle pain . Headache . Sore throat . New loss of taste or smell . Fatigue . Congestion or runny nose . Nausea or vomiting . Diarrhea  It is vitally important that if you feel that you have an infection such as this virus or any other virus that you stay home and away from places where you may spread it to others.  You should self-quarantine for 14 days if you have symptoms that could potentially be coronavirus or have been in close contact a with a person diagnosed with COVID-19 within the last 2 weeks. You should avoid contact with people age 34 and older.   You should wear a mask or cloth face covering over your nose and mouth if you must be around other people or animals, including pets (even at home). Try to stay at least 6 feet away from other people. This will protect the people around you.  You can use medication such as mucinex or delsym if develop a cough  You may also take acetaminophen (Tylenol) as needed for fever.   Reduce your risk of any infection by using the same precautions used for avoiding the common cold or flu:  Marland Kitchen. Wash your hands often with soap and warm water for at least 20 seconds.  If soap and water are not readily available, use an alcohol-based hand sanitizer with at least 60% alcohol.  . If coughing or sneezing, cover your mouth and nose by coughing or sneezing into the  elbow areas of your shirt or coat, into a tissue or into your sleeve (not your hands). . Avoid shaking hands with others and consider head nods or verbal greetings only. . Avoid touching your eyes, nose, or mouth with unwashed hands.  . Avoid close contact with people who are sick. . Avoid places or events with large numbers of people in one location, like concerts or sporting events. . Carefully consider travel plans you have or are making. . If you are planning any travel outside or inside the [place], visit the CDC's Travelers' Health webpage for the latest health notices. . If you have some symptoms but not all symptoms, continue to monitor at home and seek medical attention if your symptoms worsen. . If you are having a medical emergency, call 911.  HOME CARE . Only take medications as instructed by your medical team. . Drink plenty of fluids and get plenty of rest. . A steam or ultrasonic humidifier can help if you have congestion.   GET HELP RIGHT AWAY IF YOU HAVE EMERGENCY WARNING SIGNS** FOR COVID-19. If you or someone is showing any of these signs seek emergency medical care immediately. Call 911 or proceed to your closest emergency facility if: . You develop worsening high fever. . Trouble breathing . Bluish lips or face . Persistent pain or pressure in the chest . New confusion . Inability to wake  or stay awake . You cough up blood. . Your symptoms become more severe  **This list is not all possible symptoms. Contact your medical provider for any symptoms that are sever or concerning to you.   MAKE SURE YOU   Understand these instructions.  Will watch your condition.  Will get help right away if you are not doing well or get worse.  Your e-visit answers were reviewed by a board certified advanced clinical practitioner to complete your personal care plan.  Depending on the condition, your plan could have included both over the counter or prescription medications.  If there  is a problem please reply once you have received a response from your provider.  Your safety is important to [place].  If you have drug allergies check your prescription carefully.    You can use MyChart to ask questions about today's visit, request a non-urgent call back, or ask for a work or school excuse for 24 hours related to this e-Visit. If it has been greater than 24 hours you will need to follow up with your provider, or enter a new e-Visit to address those concerns. You will get an e-mail in the next two days asking about your experience.  I hope that your e-visit has been valuable and will speed your recovery. Thank you for using e-visits.   5-10 minutes spent reviewing and documenting in chart.

## 2018-10-19 ENCOUNTER — Other Ambulatory Visit: Payer: Self-pay | Admitting: Family Medicine

## 2019-01-09 ENCOUNTER — Encounter: Payer: Self-pay | Admitting: Family Medicine

## 2019-01-09 ENCOUNTER — Ambulatory Visit (INDEPENDENT_AMBULATORY_CARE_PROVIDER_SITE_OTHER): Payer: 59 | Admitting: Family Medicine

## 2019-01-09 ENCOUNTER — Other Ambulatory Visit: Payer: Self-pay

## 2019-01-09 VITALS — BP 138/74 | HR 71 | Temp 97.9°F | Ht 70.0 in | Wt 280.2 lb

## 2019-01-09 DIAGNOSIS — G4733 Obstructive sleep apnea (adult) (pediatric): Secondary | ICD-10-CM | POA: Diagnosis not present

## 2019-01-09 DIAGNOSIS — I10 Essential (primary) hypertension: Secondary | ICD-10-CM | POA: Diagnosis not present

## 2019-01-09 DIAGNOSIS — F411 Generalized anxiety disorder: Secondary | ICD-10-CM | POA: Diagnosis not present

## 2019-01-09 DIAGNOSIS — F988 Other specified behavioral and emotional disorders with onset usually occurring in childhood and adolescence: Secondary | ICD-10-CM

## 2019-01-09 DIAGNOSIS — Z23 Encounter for immunization: Secondary | ICD-10-CM

## 2019-01-09 DIAGNOSIS — Z9989 Dependence on other enabling machines and devices: Secondary | ICD-10-CM

## 2019-01-09 NOTE — Patient Instructions (Signed)
° ° ° °  If you have lab work done today you will be contacted with your lab results within the next 2 weeks.  If you have not heard from us then please contact us. The fastest way to get your results is to register for My Chart. ° ° °IF you received an x-ray today, you will receive an invoice from [facility]. Please contact [facility] at [phone] with questions or concerns regarding your invoice.  ° °IF you received labwork today, you will receive an invoice from [facility]. Please contact [facility] at [phone] with questions or concerns regarding your invoice.  ° °Our billing staff will not be able to assist you with questions regarding bills from these companies. ° °You will be contacted with the lab results as soon as they are available. The fastest way to get your results is to activate your My Chart account. Instructions are located on the last page of this paperwork. If you have not heard from us regarding the results in 2 weeks, please contact this office. °  ° ° ° °

## 2019-01-09 NOTE — Progress Notes (Signed)
10/26/20203:48 PM  ELWYN KLOSINSKI [birth year], 33 y.o., male [id number]  Chief Complaint  Patient presents with  . Hypertension  . med check    HPI:   Patient is a 33 y.o. male with past medical history significant for HTN, OSA on cpap, pancreatic mass, ADD, anxiety who presents today for routine followup  Last OV April 2020 - telemedicine Referred to counseling - never saw them Recently has established with [place] attention specialist, dx with ADD and social anxiety, started on vyvanse, recently increased to 49m, doing much better, feels anxiety has positively been improved as well Patient does not want to pursue pancreatic mass, was told it was benign Continues to use cpap at night, sleeping better Checks bp occasionally Taking all meds as rx, denies any side effects Declines HIV testing Requesting Td and flu vaccine  [facility] 01/09/2019 07/08/2018 12/20/2017  Decreased Interest 0 0 0  Down, Depressed, Hopeless 0 0 0  PHQ - 2 Score 0 0 0    Fall Risk  01/09/2019 07/08/2018 04/07/2018 12/20/2017 11/12/2017  Falls in the past year? 0 0 0 No No  Number falls in past yr: 0 0 - - -  Injury with Fall? 0 0 - - -  Follow up Falls evaluation completed Falls evaluation completed - - -     No Known Allergies  Prior to Admission medications   Medication Sig Start Date End Date Taking? Authorizing Provider  amLODipine (NORVASC) 5 MG tablet Take 1 tablet (5 mg total) by mouth daily. 04/07/18  Yes SRutherford Guys MD  Blood Pressure Monitoring (BLOOD PRESSURE MONITOR DELUXE) KIT 1 Units by Does not apply route daily. 11/12/17  Yes WTimmothy Euler BTanzaniaD, PA-C  ibuprofen (ADVIL,MOTRIN) 200 MG tablet Take 200-600 mg by mouth daily as needed for moderate pain.   Yes [provider]  lisdexamfetamine (VYVANSE) 40 MG capsule  12/22/18  Yes [provider]  losartan-hydrochlorothiazide (HYZAAR) 100-25 MG tablet TAKE 1 TABLET BY MOUTH DAILY 08/25/18  Yes SRutherford Guys MD  sertraline (ZOLOFT) 50 MG tablet TAKE 1 TABLET(50 MG) BY MOUTH DAILY 10/19/18  Yes SRutherford Guys MD    Past Medical History:  Diagnosis Date  . Hypertension   . Renal disorder     Past Surgical History:  Procedure Laterality Date  . BIOPSY  10/28/2017   Procedure: BIOPSY;  Surgeon: MIrving Copas, MD;  [facility];  Service: Gastroenterology;;  . ESOPHAGOGASTRODUODENOSCOPY (EGD) WITH PROPOFOL N/A 10/28/2017   Procedure: ESOPHAGOGASTRODUODENOSCOPY (EGD) WITH PROPOFOL;  Surgeon: MIrving Copas, MD;  [facility];  Service: Gastroenterology;  Laterality: N/A;  . EUS N/A 10/28/2017   Procedure: UPPER ENDOSCOPIC ULTRASOUND (EUS) RADIAL;  Surgeon: MRush LandmarkGTelford Nab, MD;  [facility];  Service: Gastroenterology;  Laterality: N/A;  . FINE NEEDLE ASPIRATION  10/28/2017   Procedure: FINE NEEDLE ASPIRATION;  Surgeon: MRush LandmarkGTelford Nab, MD;  [facility];  Service: Gastroenterology;;    Social History   Tobacco Use  . Smoking status: Never Smoker  . Smokeless tobacco: Never Used  Substance Use Topics  . Alcohol use: Yes    Alcohol/week: 3.0 standard drinks    Types: 3 Cans of beer per week    Family History  Problem Relation Age of Onset  . Diabetes Maternal Grandmother   . Pancreatic cancer Neg Hx   . Pancreatitis Neg Hx   . Pancreatic disease Neg Hx   . Liver disease Neg Hx   .  Gallbladder disease Neg Hx   . Colon cancer Neg Hx     Review of Systems  Constitutional: Negative for chills and fever.  Respiratory: Negative for cough and shortness of breath.   Cardiovascular: Negative for chest pain, palpitations and leg swelling.  Gastrointestinal: Negative for abdominal pain, nausea and vomiting.     OBJECTIVE:  Today's Vitals   01/09/19 1534  BP: 138/74  Pulse: 71  Temp: 97.9 F (36.6 C)  TempSrc: Oral  SpO2: 100%  Weight: 280 lb 3.2 oz (127.1 kg)  Height: '5\' 10"'  (1.778 m)   Body  mass index is 40.2 kg/m.   Physical Exam Vitals signs and nursing note reviewed.  Constitutional:      Appearance: He is well-developed.  HENT:     Head: Normocephalic and atraumatic.  Eyes:     Conjunctiva/sclera: Conjunctivae normal.     Pupils: Pupils are equal, round, and reactive to light.  Neck:     Musculoskeletal: Neck supple.  Cardiovascular:     Rate and Rhythm: Normal rate and regular rhythm.     Heart sounds: No murmur. No friction rub. No gallop.   Pulmonary:     Effort: Pulmonary effort is normal.     Breath sounds: Normal breath sounds. No wheezing or rales.  Musculoskeletal:     Right lower leg: No edema.     Left lower leg: No edema.  Skin:    General: Skin is warm and dry.  Neurological:     Mental Status: He is alert and oriented to person, place, and time.     No results found for this or any previous visit (from the past 24 hour(s)).  No results found.   ASSESSMENT and PLAN  1. Essential hypertension Controlled. Continue current regime.  - Basic Metabolic Panel  2. Generalized anxiety disorder Stable. Cont current regime. Patient will try to get counseling via psych office  3. Attention deficit disorder (ADD) without hyperactivity Managed by psych  4. OSA on CPAP Patient reports nightly compliance, managed by sleep  5. Need for vaccination - Flu Vaccine QUAD 36+ mos IM - Td vaccine greater than or equal to 7yo preservative free IM   Return in about 6 months (around 07/10/2019).    Rutherford Guys, MD Primary Care at [place], [place] [address] Ph.  [phone] Fax [phone]

## 2019-01-10 LAB — BASIC METABOLIC PANEL
BUN/Creatinine Ratio: 25 — ABNORMAL HIGH (ref 9–20)
BUN: 21 mg/dL — ABNORMAL HIGH (ref 6–20)
CO2: 23 mmol/L (ref 20–29)
Calcium: 10.2 mg/dL (ref 8.7–10.2)
Chloride: 105 mmol/L (ref 96–106)
Creatinine, Ser: 0.83 mg/dL (ref 0.76–1.27)
GFR calc Af Amer: 133 mL/min/{1.73_m2} (ref >59)
GFR calc non Af Amer: 115 mL/min/{1.73_m2} (ref >59)
Glucose: 88 mg/dL (ref 65–99)
Potassium: 4 mmol/L (ref 3.5–5.2)
Sodium: 141 mmol/L (ref 134–144)

## 2019-01-23 ENCOUNTER — Other Ambulatory Visit: Payer: Self-pay | Admitting: Family Medicine

## 2019-03-09 ENCOUNTER — Encounter: Payer: Self-pay | Admitting: Family Medicine

## 2019-03-13 ENCOUNTER — Other Ambulatory Visit: Payer: Self-pay | Admitting: *Deleted

## 2019-03-13 MED ORDER — LOSARTAN POTASSIUM-HCTZ 100-25 MG PO TABS: 1.0000 | ORAL_TABLET | Freq: Every day | ORAL | 1 refills | Status: DC

## 2019-04-03 IMAGING — CT CT RENAL STONE PROTOCOL
2 of 4 series · 16 of 46 positions shown, 18 images · non-contrast
Comparison: May 31, 2017

CLINICAL DATA: Right flank pain for 7 hours.

EXAM:
CT ABDOMEN AND PELVIS WITHOUT CONTRAST
TECHNIQUE: Multidetector CT imaging of the abdomen and pelvis was performed
following the standard protocol without IV contrast.

[Series 2: axial st · axial · 0.98mm/px · z∈[+1160,+1590]mm · 13 of 98 slices shown, 15 images]
[im 6/98  soft-tissue]
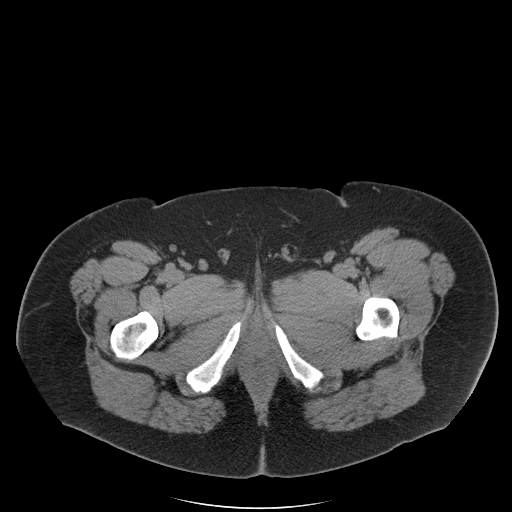
[im 6/98  bone]
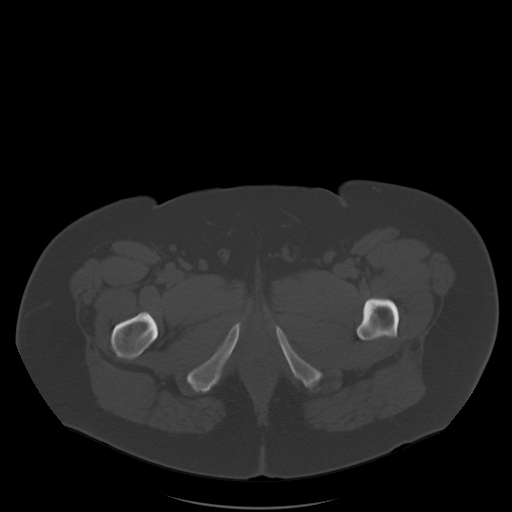
[im 11/98  soft-tissue]
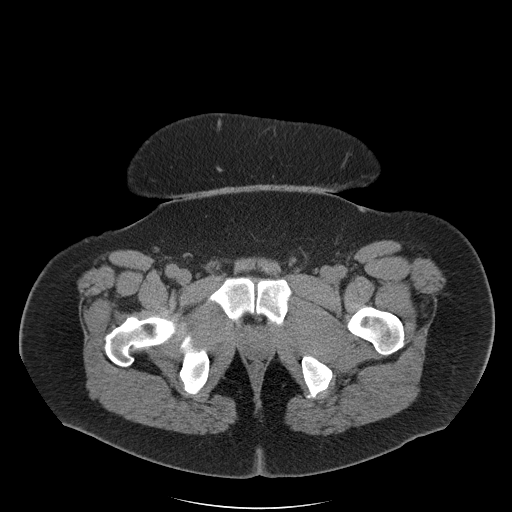
[im 22/98  soft-tissue]
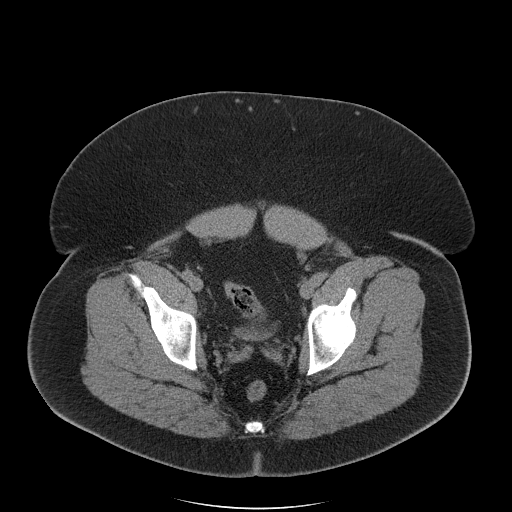
[im 27/98  soft-tissue]
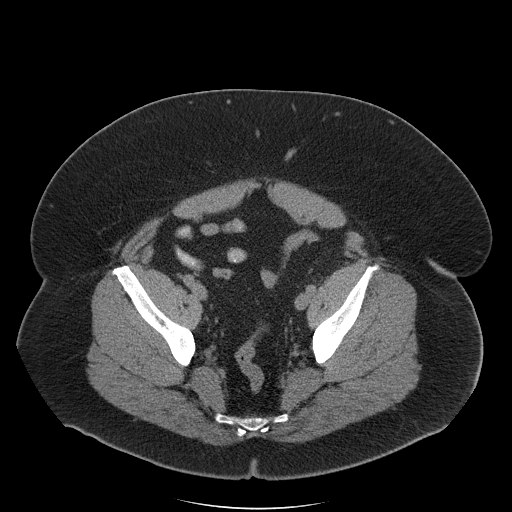
[im 33/98  soft-tissue]
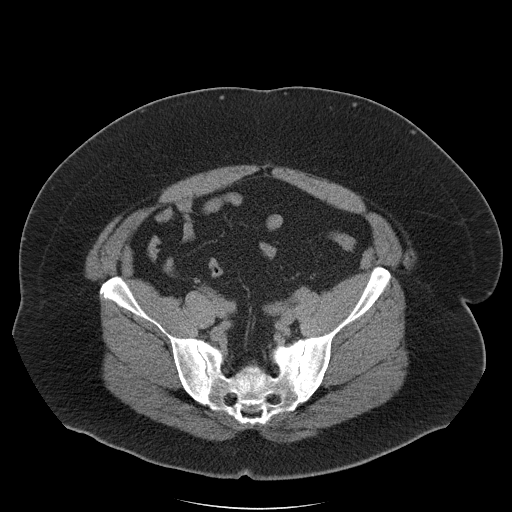
[im 44/98  soft-tissue]
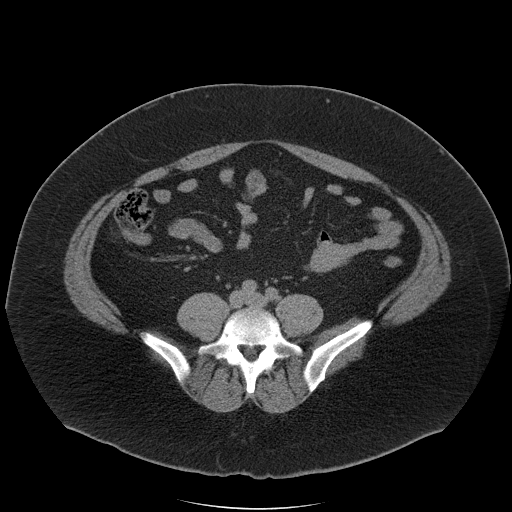
[im 49/98  soft-tissue]
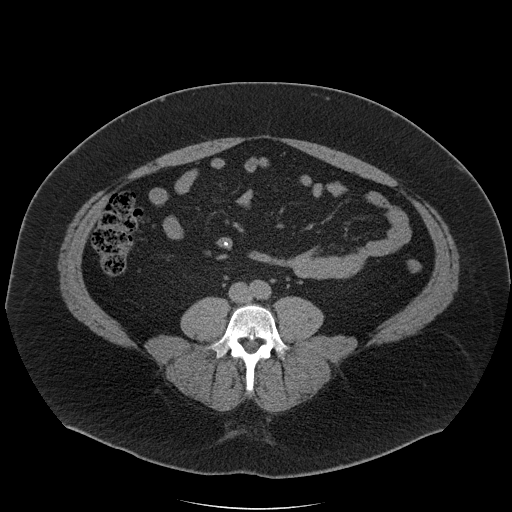
[im 54/98  soft-tissue]
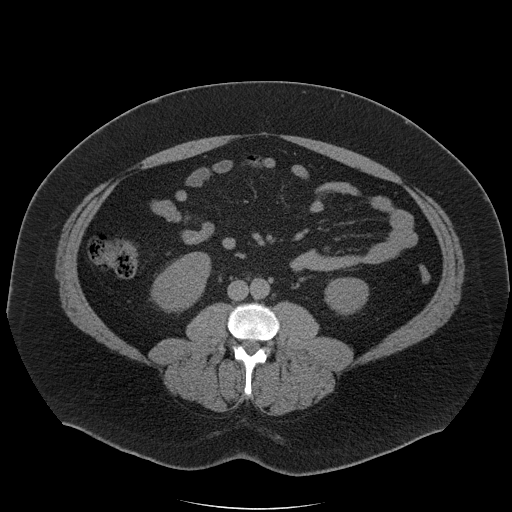
[im 65/98  soft-tissue]
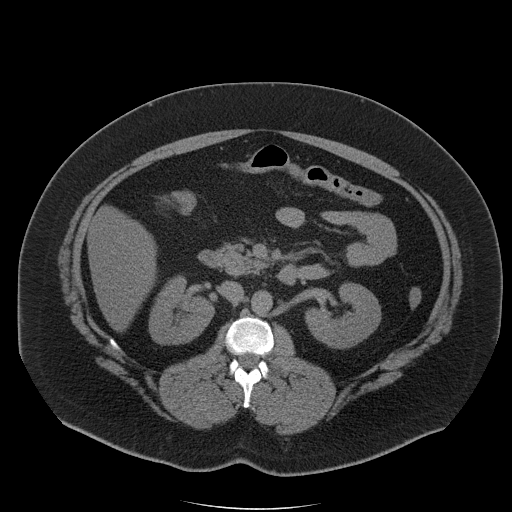
[im 65/98  bone]
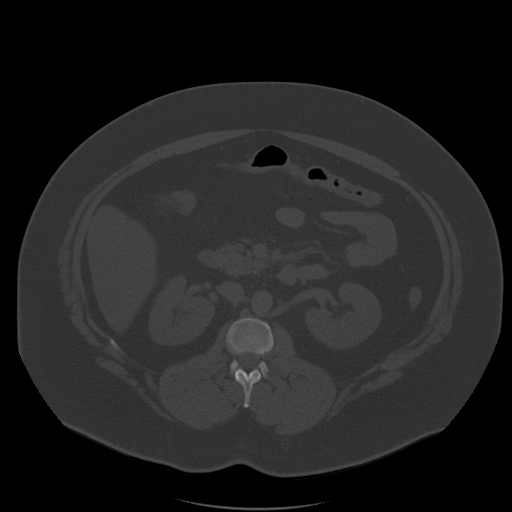
[im 71/98  soft-tissue]
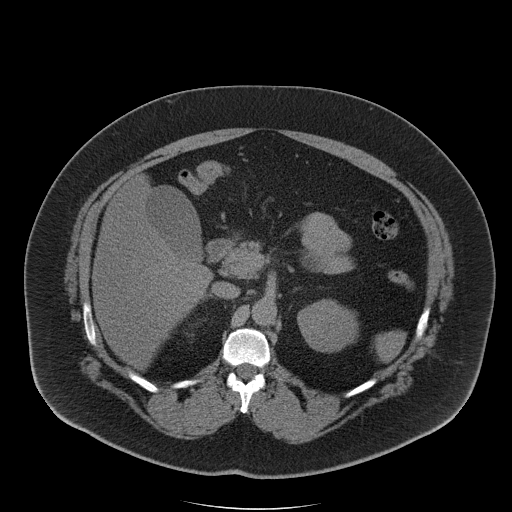
[im 76/98  soft-tissue]
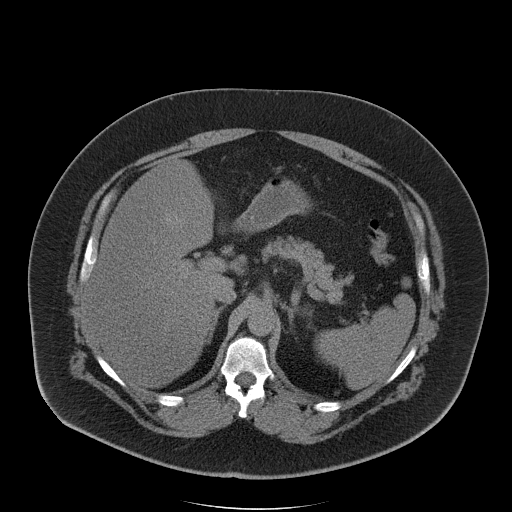
[im 87/98  soft-tissue]
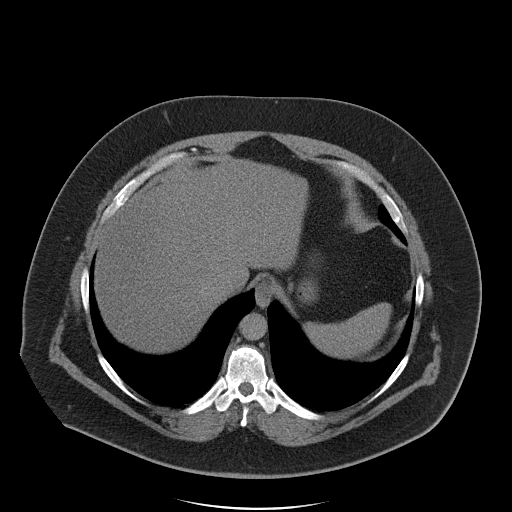
[im 92/98  soft-tissue]
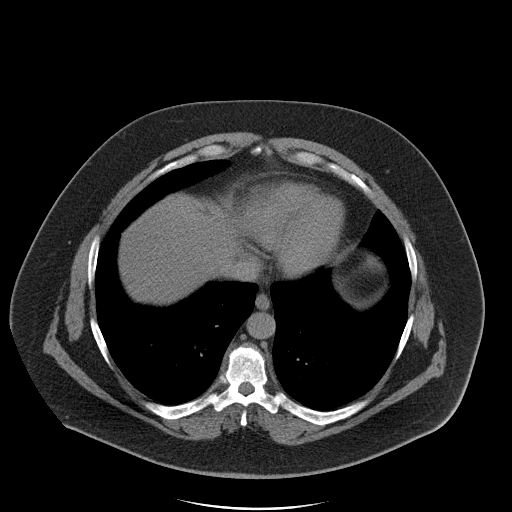

[Series 5: coronal · coronal · 0.92mm/px · 3 of 203 slices shown]
[im 68/203  soft-tissue]
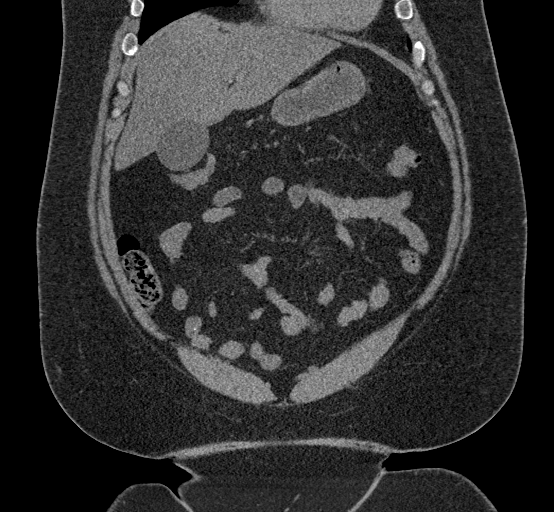
[im 90/203  soft-tissue]
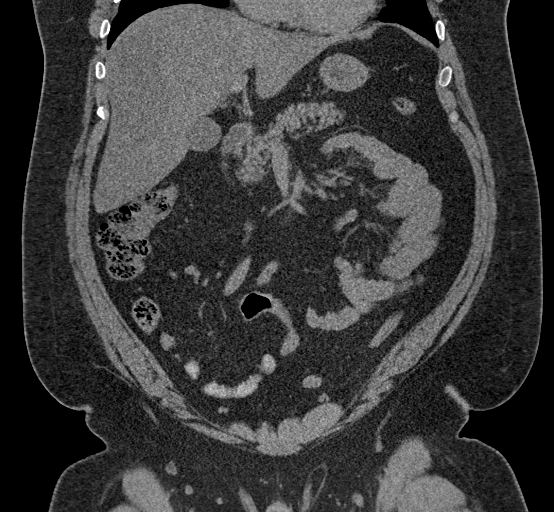
[im 113/203  soft-tissue]
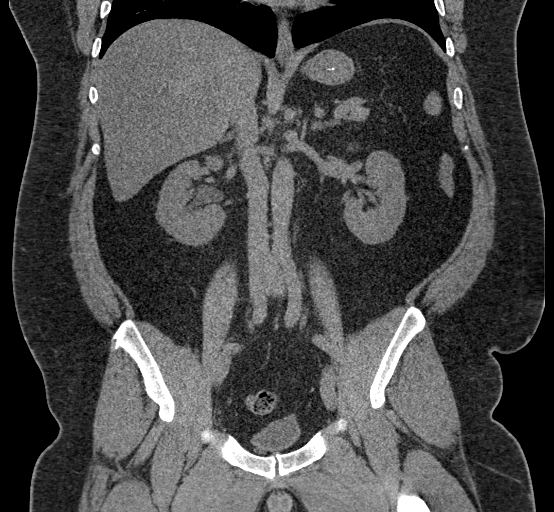

[16 of 46 positions shown; findings below may reference images not displayed]

FINDINGS: Lower chest: No acute abnormality.

Hepatobiliary: Diffuse low density liver without vessel displacement
is identified. No focal liver lesions identified. The gallbladder is
normal. The biliary tree is normal.

Pancreas: In the tail the pancreas, there is a 2 x 2.3 cm solid
mass. No surrounding inflammatory change is identified.

Spleen: Normal in size without focal abnormality.

Adrenals/Urinary Tract: The adrenal glands are normal. There is a
stone in a calyx of lower pole right kidney measuring 7 mm. There is
mild enlargement the right renal pelvis. No focal discrete
obstructing stone is identified right ureter or ureteropelvic
junction. The left kidney is normal. The bladder is normal.

Stomach/Bowel: Stomach is within normal limits. Appendix appears
normal. No evidence of bowel wall thickening, distention, or
inflammatory changes.

Vascular/Lymphatic: No significant vascular findings are present. No
enlarged abdominal or pelvic lymph nodes.

Reproductive: Prostate is unremarkable.

Other: No abdominal wall hernia or abnormality. No abdominopelvic
ascites.

Musculoskeletal: Degenerative joint changes of the spine are noted.
IMPRESSION: There is a stone in a calyx of lower pole right kidney measuring 7
mm. There is mild enlargement the right renal pelvis. No focal
discrete obstructing stone is identified right ureter or
ureteropelvic junction.

Fatty infiltration of liver.

2 x 2.3 cm solid mass pancreatic tail. Further evaluation with MRI
recommended.

## 2019-05-04 ENCOUNTER — Other Ambulatory Visit: Payer: Self-pay | Admitting: Family Medicine

## 2019-05-04 MED ORDER — SERTRALINE HCL 50 MG PO TABS: 50.0000 mg | ORAL_TABLET | Freq: Every day | ORAL | 1 refills | Status: DC

## 2019-07-10 ENCOUNTER — Encounter: Payer: Self-pay | Admitting: Family Medicine

## 2019-07-10 ENCOUNTER — Other Ambulatory Visit: Payer: Self-pay

## 2019-07-10 ENCOUNTER — Ambulatory Visit (INDEPENDENT_AMBULATORY_CARE_PROVIDER_SITE_OTHER): Payer: 59 | Admitting: Family Medicine

## 2019-07-10 VITALS — BP 108/70 | HR 73 | Temp 98.3°F | Ht 70.0 in | Wt 249.0 lb

## 2019-07-10 DIAGNOSIS — I10 Essential (primary) hypertension: Secondary | ICD-10-CM

## 2019-07-10 DIAGNOSIS — F988 Other specified behavioral and emotional disorders with onset usually occurring in childhood and adolescence: Secondary | ICD-10-CM

## 2019-07-10 DIAGNOSIS — G4733 Obstructive sleep apnea (adult) (pediatric): Secondary | ICD-10-CM

## 2019-07-10 DIAGNOSIS — Z6835 Body mass index (BMI) 35.0-35.9, adult: Secondary | ICD-10-CM

## 2019-07-10 DIAGNOSIS — F411 Generalized anxiety disorder: Secondary | ICD-10-CM | POA: Diagnosis not present

## 2019-07-10 DIAGNOSIS — Z9989 Dependence on other enabling machines and devices: Secondary | ICD-10-CM

## 2019-07-10 MED ORDER — LOSARTAN POTASSIUM-HCTZ 100-25 MG PO TABS: 1.0000 | ORAL_TABLET | Freq: Every day | ORAL | 1 refills | Status: DC

## 2019-07-10 MED ORDER — SERTRALINE HCL 50 MG PO TABS: 50.0000 mg | ORAL_TABLET | Freq: Every day | ORAL | 1 refills | Status: DC

## 2019-07-10 NOTE — Progress Notes (Signed)
4/26/20213:00 PM  ODAI Edwards [birth year], 34 y.o., male [id number]  Chief Complaint  Patient presents with  . Hypertension    not taken amlodipine in 3 months avg - 108/70     HPI:   Patient is a 34 y.o. male with past medical history significant for  HTN, OSA on cpap, pancreatic mass, ADD, anxiety who presents today for routine followup  Last OV oct 2020 - no changes Patient has stopped taking amlodipine, has cont taking losartan-hctz Has been doing well  Cont to see [place] attention specialist Using cpap every night - tolerating it better, sleep has definitely improved Anxiety well controlled on current dose sertraline, did trial off and could tell difference off medication Has been working on diet sign, low carb diet   Depression screen [facility] 2/9 07/10/2019 01/09/2019 07/08/2018  Decreased Interest 0 0 0  Down, Depressed, Hopeless 0 0 0  PHQ - 2 Score 0 0 0    Fall Risk  07/10/2019 01/09/2019 07/08/2018 04/07/2018 12/20/2017  Falls in the past year? 0 0 0 0 No  Number falls in past yr: 0 0 0 - -  Injury with Fall? 0 0 0 - -  Follow up Falls evaluation completed Falls evaluation completed Falls evaluation completed - -     No Known Allergies  Prior to Admission medications   Medication Sig Start Date End Date Taking? Authorizing Provider  ibuprofen (ADVIL,MOTRIN) 200 MG tablet Take 200-600 mg by mouth daily as needed for moderate pain.   Yes [provider]  lisdexamfetamine (VYVANSE) 40 MG capsule  12/22/18  Yes [provider]  losartan-hydrochlorothiazide (HYZAAR) 100-25 MG tablet Take 1 tablet by mouth daily. 03/13/19  Yes Rutherford Guys, MD  sertraline (ZOLOFT) 50 MG tablet Take 1 tablet (50 mg total) by mouth daily. 05/04/19  Yes Rutherford Guys, MD  amLODipine (NORVASC) 5 MG tablet Take 1 tablet (5 mg total) by mouth daily. Patient not taking: Reported on 07/10/2019 04/07/18   Rutherford Guys, MD  Blood Pressure Monitoring (BLOOD PRESSURE  MONITOR DELUXE) KIT 1 Units by Does not apply route daily. 11/12/17   Tenna Delaine D, PA-C  VYVANSE 60 MG CHEW Chew 1 tablet by mouth daily. 06/23/19   [provider]    Past Medical History:  Diagnosis Date  . Hypertension   . Renal disorder     Past Surgical History:  Procedure Laterality Date  . BIOPSY  10/28/2017   Procedure: BIOPSY;  Surgeon: Irving Copas., MD;  [facility];  Service: Gastroenterology;;  . ESOPHAGOGASTRODUODENOSCOPY (EGD) WITH PROPOFOL N/A 10/28/2017   Procedure: ESOPHAGOGASTRODUODENOSCOPY (EGD) WITH PROPOFOL;  Surgeon: Irving Copas., MD;  [facility];  Service: Gastroenterology;  Laterality: N/A;  . EUS N/A 10/28/2017   Procedure: UPPER ENDOSCOPIC ULTRASOUND (EUS) RADIAL;  Surgeon: Rush Landmark Telford Nab., MD;  [facility];  Service: Gastroenterology;  Laterality: N/A;  . FINE NEEDLE ASPIRATION  10/28/2017   Procedure: FINE NEEDLE ASPIRATION;  Surgeon: Rush Landmark Telford Nab., MD;  [facility];  Service: Gastroenterology;;    Social History   Tobacco Use  . Smoking status: Never Smoker  . Smokeless tobacco: Never Used  Substance Use Topics  . Alcohol use: Yes    Alcohol/week: 3.0 standard drinks    Types: 3 Cans of beer per week    Family History  Problem Relation Age of Onset  . Diabetes Maternal Grandmother   . Pancreatic cancer Neg Hx   . Pancreatitis Neg  Hx   . Pancreatic disease Neg Hx   . Liver disease Neg Hx   . Gallbladder disease Neg Hx   . Colon cancer Neg Hx     Review of Systems  Constitutional: Negative for chills and fever.  Respiratory: Negative for cough and shortness of breath.   Cardiovascular: Negative for chest pain, palpitations and leg swelling.  Gastrointestinal: Negative for abdominal pain, nausea and vomiting.     OBJECTIVE:  Today's Vitals   07/10/19 1505  BP: 108/70  Pulse: 73  Temp: 98.3 F (36.8 C)  SpO2: 97%  Weight: 249 lb (112.9  kg)  Height: 5' 10" (1.778 m)   Body mass index is 35.73 kg/m.   Wt Readings from Last 3 Encounters:  07/10/19 249 lb (112.9 kg)  01/09/19 280 lb 3.2 oz (127.1 kg)  04/07/18 (!) 321 lb 12.8 oz (146 kg)     Physical Exam Vitals and nursing note reviewed.  Constitutional:      Appearance: He is well-developed.  HENT:     Head: Normocephalic and atraumatic.  Eyes:     Conjunctiva/sclera: Conjunctivae normal.     Pupils: Pupils are equal, round, and reactive to light.  Cardiovascular:     Rate and Rhythm: Normal rate and regular rhythm.     Heart sounds: No murmur. No friction rub. No gallop.   Pulmonary:     Effort: Pulmonary effort is normal.     Breath sounds: Normal breath sounds. No wheezing or rales.  Musculoskeletal:     Cervical back: Neck supple.  Skin:    General: Skin is warm and dry.  Neurological:     Mental Status: He is alert and oriented to person, place, and time.     No results found for this or any previous visit (from the past 24 hour(s)).  No results found.   ASSESSMENT and PLAN  1. Essential hypertension Controlled. Continue current regime. Cont home BP monitoring as might need to further decreased meds with continued weight loss - Basic Metabolic Panel  2. Generalized anxiety disorder Controlled. Continue current regime.   3. OSA on CPAP Reports compliance, managed by sleep  4. Attention deficit disorder (ADD) without hyperactivity Managed by psych  5. BMI 35.0-35.9,adult congratulated patient on sign weight loss. His goal is to reach 200 lbs  Other orders - VYVANSE 60 MG CHEW; Chew 1 tablet by mouth daily. - sertraline (ZOLOFT) 50 MG tablet; Take 1 tablet (50 mg total) by mouth daily. - losartan-hydrochlorothiazide (HYZAAR) 100-25 MG tablet; Take 1 tablet by mouth daily.  Return in about 6 months (around 01/09/2020).    Rutherford Guys, MD Primary Care at [place], [place] [address] Ph.  [phone] Fax  [phone]

## 2019-07-10 NOTE — Patient Instructions (Signed)
° ° ° °  If you have lab work done today you will be contacted with your lab results within the next 2 weeks.  If you have not heard from us then please contact us. The fastest way to get your results is to register for My Chart. ° ° °IF you received an x-ray today, you will receive an invoice from [facility]. Please contact [facility] at [phone] with questions or concerns regarding your invoice.  ° °IF you received labwork today, you will receive an invoice from [facility]. Please contact [facility] at [phone] with questions or concerns regarding your invoice.  ° °Our billing staff will not be able to assist you with questions regarding bills from these companies. ° °You will be contacted with the lab results as soon as they are available. The fastest way to get your results is to activate your My Chart account. Instructions are located on the last page of this paperwork. If you have not heard from us regarding the results in 2 weeks, please contact this office. °  ° ° ° °

## 2019-07-11 LAB — BASIC METABOLIC PANEL
BUN/Creatinine Ratio: 30 — ABNORMAL HIGH (ref 9–20)
BUN: 31 mg/dL — ABNORMAL HIGH (ref 6–20)
CO2: 25 mmol/L (ref 20–29)
Calcium: 9.7 mg/dL (ref 8.7–10.2)
Chloride: 103 mmol/L (ref 96–106)
Creatinine, Ser: 1.03 mg/dL (ref 0.76–1.27)
GFR calc Af Amer: 108 mL/min/{1.73_m2} (ref >59)
GFR calc non Af Amer: 94 mL/min/{1.73_m2} (ref >59)
Glucose: 83 mg/dL (ref 65–99)
Potassium: 4.1 mmol/L (ref 3.5–5.2)
Sodium: 142 mmol/L (ref 134–144)

## 2019-10-13 ENCOUNTER — Other Ambulatory Visit: Payer: Self-pay | Admitting: Family Medicine

## 2019-12-18 ENCOUNTER — Other Ambulatory Visit: Payer: Self-pay | Admitting: Emergency Medicine

## 2019-12-18 ENCOUNTER — Encounter: Payer: Self-pay | Admitting: Family Medicine

## 2019-12-18 DIAGNOSIS — I10 Essential (primary) hypertension: Secondary | ICD-10-CM

## 2019-12-18 DIAGNOSIS — F411 Generalized anxiety disorder: Secondary | ICD-10-CM

## 2019-12-18 MED ORDER — SERTRALINE HCL 50 MG PO TABS
ORAL_TABLET | ORAL | 0 refills | Status: AC
Start: 2019-12-18 — End: ?

## 2019-12-18 MED ORDER — LOSARTAN POTASSIUM-HCTZ 100-25 MG PO TABS: 1.0000 | ORAL_TABLET | Freq: Every day | ORAL | 0 refills | Status: AC

## 2020-01-01 ENCOUNTER — Ambulatory Visit: Payer: 59 | Admitting: Family Medicine

## 2020-01-31 ENCOUNTER — Telehealth: Payer: Self-pay

## 2020-01-31 NOTE — Telephone Encounter (Signed)
-----  Message from Irving Copas., MD sent at 01/31/2020  6:29 AM EST ----- Regarding: Follow up Anthony Edwards, This is a patient we met a few years ago. Had a pancreatic lesion that we did an EUS on and was benign. Had recommended repeat imaging. It has been a couple of years and the orders are expired. I would try to reach out to him and see if he is willing to have the imaging studies performed to ensure nothing is being missed at this time due to his age. It is his decision at the end of the day. Thanks for reaching out. GM

## 2020-01-31 NOTE — Telephone Encounter (Signed)
Certified letter was mailed to the pt in Dec of 2019 with no response from the pt.  Pt was advised to contact our office if he was willing to proceed as recommended.
